# Patient Record
Sex: Male | Born: 1945 | Race: White | Hispanic: No | Smoking: Never smoker
Health system: Southern US, Community
[De-identification: ages and names within clinical notes are randomized; demographics above are authoritative.]

## PROBLEM LIST (undated history)

## (undated) DIAGNOSIS — M1990 Unspecified osteoarthritis, unspecified site: Secondary | ICD-10-CM

## (undated) DIAGNOSIS — Z973 Presence of spectacles and contact lenses: Secondary | ICD-10-CM

## (undated) DIAGNOSIS — C61 Malignant neoplasm of prostate: Secondary | ICD-10-CM

## (undated) DIAGNOSIS — K219 Gastro-esophageal reflux disease without esophagitis: Secondary | ICD-10-CM

## (undated) HISTORY — PX: TONSILLECTOMY: SUR1361

## (undated) HISTORY — PX: PROSTATE BIOPSY: SHX241

---

## 2001-10-10 ENCOUNTER — Ambulatory Visit (HOSPITAL_COMMUNITY): Admission: RE | Admit: 2001-10-10 | Discharge: 2001-10-10 | Payer: Self-pay | Admitting: Gastroenterology

## 2008-08-17 HISTORY — PX: TOTAL HIP ARTHROPLASTY: SHX124

## 2017-08-02 DIAGNOSIS — Z23 Encounter for immunization: Secondary | ICD-10-CM | POA: Diagnosis not present

## 2017-08-03 DIAGNOSIS — K921 Melena: Secondary | ICD-10-CM | POA: Diagnosis not present

## 2018-06-14 DIAGNOSIS — Z23 Encounter for immunization: Secondary | ICD-10-CM | POA: Diagnosis not present

## 2018-11-11 DIAGNOSIS — M25539 Pain in unspecified wrist: Secondary | ICD-10-CM | POA: Diagnosis not present

## 2019-05-26 DIAGNOSIS — Z23 Encounter for immunization: Secondary | ICD-10-CM | POA: Diagnosis not present

## 2019-08-18 HISTORY — PX: COLONOSCOPY: SHX174

## 2020-04-10 DIAGNOSIS — Z136 Encounter for screening for cardiovascular disorders: Secondary | ICD-10-CM | POA: Diagnosis not present

## 2020-04-10 DIAGNOSIS — R5383 Other fatigue: Secondary | ICD-10-CM | POA: Diagnosis not present

## 2020-04-10 DIAGNOSIS — Z20822 Contact with and (suspected) exposure to covid-19: Secondary | ICD-10-CM | POA: Diagnosis not present

## 2020-04-10 DIAGNOSIS — N5089 Other specified disorders of the male genital organs: Secondary | ICD-10-CM | POA: Diagnosis not present

## 2020-04-10 DIAGNOSIS — Z125 Encounter for screening for malignant neoplasm of prostate: Secondary | ICD-10-CM | POA: Diagnosis not present

## 2020-04-10 DIAGNOSIS — L821 Other seborrheic keratosis: Secondary | ICD-10-CM | POA: Diagnosis not present

## 2020-04-10 DIAGNOSIS — R972 Elevated prostate specific antigen [PSA]: Secondary | ICD-10-CM | POA: Diagnosis not present

## 2020-04-10 DIAGNOSIS — R202 Paresthesia of skin: Secondary | ICD-10-CM | POA: Diagnosis not present

## 2020-04-23 DIAGNOSIS — N529 Male erectile dysfunction, unspecified: Secondary | ICD-10-CM | POA: Diagnosis not present

## 2020-04-23 DIAGNOSIS — G5602 Carpal tunnel syndrome, left upper limb: Secondary | ICD-10-CM | POA: Diagnosis not present

## 2020-04-23 DIAGNOSIS — Z9189 Other specified personal risk factors, not elsewhere classified: Secondary | ICD-10-CM | POA: Diagnosis not present

## 2020-04-23 DIAGNOSIS — R972 Elevated prostate specific antigen [PSA]: Secondary | ICD-10-CM | POA: Diagnosis not present

## 2020-04-30 DIAGNOSIS — N503 Cyst of epididymis: Secondary | ICD-10-CM | POA: Diagnosis not present

## 2020-04-30 DIAGNOSIS — N5089 Other specified disorders of the male genital organs: Secondary | ICD-10-CM | POA: Diagnosis not present

## 2020-04-30 DIAGNOSIS — N433 Hydrocele, unspecified: Secondary | ICD-10-CM | POA: Diagnosis not present

## 2020-08-17 DIAGNOSIS — Z8616 Personal history of COVID-19: Secondary | ICD-10-CM

## 2020-08-17 HISTORY — DX: Personal history of COVID-19: Z86.16

## 2020-12-04 ENCOUNTER — Encounter: Payer: Self-pay | Admitting: Radiation Oncology

## 2020-12-04 NOTE — Progress Notes (Signed)
GU Location of Tumor / Histology: prostatic adenocarcinoma  If Prostate Cancer, Gleason Score is (3 + 4) and PSA is (4.6). Prostate volume: 21.7  Gerald Keith was initially consulted for further evaluation of an elevated psa in November 2021.    Biopsies of prostate (if applicable) revealed:   Past/Anticipated interventions by urology, if any: psa surveillance, prostate biopsy, referral to Dr. Tammi Klippel to discuss radiation options  Past/Anticipated interventions by medical oncology, if any: no  Weight changes, if any: denies  Bowel/Bladder complaints, if any: IPSS 3. SHIM 5. Denies dysuria or hematuria. Reports urinary leakage only if he is in a situation where he can't get to the bathroom. Denies any bowel complaints.   Nausea/Vomiting, if any: denies  Pain issues, if any:  denies  SAFETY ISSUES:  Prior radiation? denies  Pacemaker/ICD? Denies  Possible current pregnancy? no, male patient  Is the patient on methotrexate? denies  Current Complaints / other details:  75 year old male. Widowed. Resides in [place]. Accompanied by his sister today.

## 2020-12-06 ENCOUNTER — Ambulatory Visit
Admission: RE | Admit: 2020-12-06 | Discharge: 2020-12-06 | Disposition: A | Discharge status: Home / Self Care | Payer: Medicare Other | Source: Ambulatory Visit | Attending: Radiation Oncology | Admitting: Radiation Oncology

## 2020-12-06 ENCOUNTER — Other Ambulatory Visit: Payer: Self-pay

## 2020-12-06 ENCOUNTER — Encounter: Payer: Self-pay | Admitting: Radiation Oncology

## 2020-12-06 DIAGNOSIS — C61 Malignant neoplasm of prostate: Secondary | ICD-10-CM

## 2020-12-06 DIAGNOSIS — N529 Male erectile dysfunction, unspecified: Secondary | ICD-10-CM | POA: Insufficient documentation

## 2020-12-06 DIAGNOSIS — Z801 Family history of malignant neoplasm of trachea, bronchus and lung: Secondary | ICD-10-CM | POA: Insufficient documentation

## 2020-12-06 DIAGNOSIS — Z8052 Family history of malignant neoplasm of bladder: Secondary | ICD-10-CM | POA: Insufficient documentation

## 2020-12-06 DIAGNOSIS — Z803 Family history of malignant neoplasm of breast: Secondary | ICD-10-CM | POA: Insufficient documentation

## 2020-12-06 HISTORY — DX: Malignant neoplasm of prostate: C61

## 2020-12-06 NOTE — Progress Notes (Signed)
Radiation Oncology         (336) [phone] ________________________________  Initial outpatient Consultation  Name: Gerald Keith MRN: [id number]  Date: 12/06/2020  DOB: 22-Nov-1945  CC:Pcp, No  Franchot Gallo, MD   REFERRING PHYSICIAN: Franchot Gallo, MD  DIAGNOSIS: 75 y.o. gentleman with Stage T1c adenocarcinoma of the prostate with Gleason score of 3+4, and PSA of 4.6.    ICD-10-CM   1. Malignant neoplasm of prostate ([place])  C61     HISTORY OF PRESENT ILLNESS: Gerald Keith is a 75 y.o. male with a diagnosis of prostate cancer. He was noted to have an elevated PSA of 4.6 by his primary care physician, Dr. Lisbeth Ply.  Accordingly, he was referred for evaluation in urology by Dr. Diona Fanti on 07/01/20,  digital rectal examination was performed at that time revealing right lobe firmness but no discrete nodularity.  The patient proceeded to transrectal ultrasound with 12 biopsies of the prostate on 08/28/20.  The prostate volume measured 21.7 cc.  Out of 12 core biopsies, 5 were positive.  The maximum Gleason score was 3+4, and this was seen in the right apex and the left apex. Additionally, small foci of Gleason 3+3 were seen in the left mid lateral, the left apex lateral, and the right apex lateral.  The patient reviewed the biopsy results with his urologist and he has kindly been referred today for discussion of potential radiation treatment options.   PREVIOUS RADIATION THERAPY: No  PAST MEDICAL HISTORY:  Past Medical History:  Diagnosis Date  . Prostate cancer ([place])       PAST SURGICAL HISTORY: Past Surgical History:  Procedure Laterality Date  . PROSTATE BIOPSY      FAMILY HISTORY:  Family History  Problem Relation Age of Onset  . Lung cancer Father   . Breast cancer Sister   . Cancer Maternal Aunt   . Bladder Cancer Maternal Grandfather   . Breast cancer Cousin   . Cancer Maternal Aunt   . Prostate cancer Neg Hx   . Colon cancer Neg Hx   . Pancreatic cancer Neg Hx      SOCIAL HISTORY:  Social History   Socioeconomic History  . Marital status: Widowed    Spouse name: Not on file  . Number of children: 2  . Years of education: Not on file  . Highest education level: Not on file  Occupational History  . Not on file  Tobacco Use  . Smoking status: Never Smoker  . Smokeless tobacco: Never Used  Vaping Use  . Vaping Use: Never used  Substance and Sexual Activity  . Alcohol use: Not Currently  . Drug use: Never  . Sexual activity: Yes  Other Topics Concern  . Not on file  Social History Narrative   Children - 1 boy and 1 girl   Social Determinants of Radio broadcast assistant Strain: Not on file  Food Insecurity: Not on file  Transportation Needs: Not on file  Physical Activity: Not on file  Stress: Not on file  Social Connections: Not on file  Intimate Partner Violence: Not on file    ALLERGIES: Patient has no known allergies.  MEDICATIONS:  Current Outpatient Medications  Medication Sig Dispense Refill  . cholecalciferol (VITAMIN D3) 25 MCG (1000 UNIT) tablet Take 1,000 Units by mouth daily.    . Multiple Vitamin (MULTIVITAMIN) tablet Take 1 tablet by mouth daily.    Marland Kitchen QUERCETIN PO Take by mouth.    . Turmeric (CURCUMIN 95 PO) Take  by mouth.    . zinc sulfate (ZINC-220) 220 (50 Zn) MG capsule Take 220 mg by mouth daily.     No current facility-administered medications for this encounter.    REVIEW OF SYSTEMS:  On review of systems, the patient reports that he is doing well overall. He denies any chest pain, shortness of breath, cough, fevers, chills, night sweats, unintended weight changes. He denies any bowel disturbances, and denies abdominal pain, nausea or vomiting. He denies any new musculoskeletal or joint aches or pains. His IPSS was 3, indicating mild urinary symptoms. His SHIM was 5, indicating he has severe erectile dysfunction. A complete review of systems is obtained and is otherwise negative.   PHYSICAL EXAM:  Wt  Readings from Last 3 Encounters:  12/06/20 175 lb 6 oz (79.5 kg)   Temp Readings from Last 3 Encounters:  12/06/20 (!) 96.9 F (36.1 C) (Temporal)   BP Readings from Last 3 Encounters:  12/06/20 139/90   Pulse Readings from Last 3 Encounters:  12/06/20 81   Pain Assessment Pain Score: 0-No pain/10  In general this is a well appearing Caucasian male in no acute distress. He's alert and oriented x4 and appropriate throughout the examination. Cardiopulmonary assessment is negative for acute distress, and he exhibits normal effort.     KPS = 100  100 - Normal; no complaints; no evidence of disease. 90   - Able to carry on normal activity; minor signs or symptoms of disease. 80   - Normal activity with effort; some signs or symptoms of disease. 72   - Cares for self; unable to carry on normal activity or to do active work. 60   - Requires occasional assistance, but is able to care for most of his personal needs. 50   - Requires considerable assistance and frequent medical care. 31   - Disabled; requires special care and assistance. 39   - Severely disabled; hospital admission is indicated although death not imminent. 62   - Very sick; hospital admission necessary; active supportive treatment necessary. 10   - Moribund; fatal processes progressing rapidly. 0     - Dead  Karnofsky DA, Abelmann WH, Craver LS and Burchenal JH [phone]) The use of the nitrogen mustards in the palliative treatment of carcinoma: with particular reference to bronchogenic carcinoma Cancer 1 [phone]  LABORATORY DATA:  No results found for: WBC, HGB, HCT, MCV, PLT No results found for: NA, K, CL, CO2 No results found for: ALT, AST, GGT, ALKPHOS, BILITOT   RADIOGRAPHY: No results found.    IMPRESSION/PLAN: 1. 75 y.o. gentleman with Stage T1c adenocarcinoma of the prostate with Gleason Score of 3+4, and PSA of 4.6. We discussed the patient's workup and outlined the nature of prostate cancer in this setting. The  patient's T stage, Gleason's score, and PSA put him into the favorable intermediate risk group. Accordingly, he is eligible for a variety of potential treatment options including brachytherapy, 5.5 weeks of external radiation, or prostatectomy. We discussed the available radiation techniques, and focused on the details and logistics of delivery. We discussed and outlined the risks, benefits, short and long-term effects associated with radiotherapy and compared and contrasted these with prostatectomy. We discussed the role of SpaceOAR gel in reducing the rectal toxicity associated with radiotherapy. He appears to have a good understanding of his disease and our treatment recommendations which are of curative intent.  He and his sister were encouraged to ask questions that were answered to their stated satisfaction.  At  the conclusion of our conversation, the patient is interested in moving forward with brachytherapy and use of SpaceOAR gel to reduce rectal toxicity from radiotherapy.  We will share our discussion with Dr. Diona Fanti and move forward with scheduling his [facility] planning appointment in the near future.  The patient met briefly with Romie Jumper in our office who will be working closely with him to coordinate OR scheduling and pre and post procedure appointments.  We will contact the pharmaceutical rep to ensure that Ingram is available at the time of procedure.  We enjoyed meeting him today and look forward to continuing to participate in his care.  We spent 60 minutes total in this encounter.    Nicholos Johns, PA-C    Tyler Pita, MD  [facility] Direct Dial: [phone]  Fax: [phone] [email or web address]  Skype  LinkedIn   This document serves as a record of services personally performed by Tyler Pita, MD and Freeman Caldron, PA-C. It was created on their behalf by Wilburn Mylar, a trained medical scribe. The creation of this record is based  on the scribe's personal observations and the provider's statements to them. This document has been checked and approved by the attending provider.

## 2020-12-11 ENCOUNTER — Telehealth: Payer: Self-pay | Admitting: *Deleted

## 2020-12-11 NOTE — Telephone Encounter (Signed)
CALLED PATIENT TO UPDATE, SPOKE WITH PATIENT AND HE IS AWARE OF THESE PRE-SEED APPTS.

## 2020-12-19 ENCOUNTER — Telehealth: Payer: Self-pay | Admitting: *Deleted

## 2020-12-19 NOTE — Telephone Encounter (Signed)
Called patient to inform of pre-seed appts. and implant date, lvm for a return call

## 2021-01-29 ENCOUNTER — Telehealth: Payer: Self-pay | Admitting: *Deleted

## 2021-01-29 NOTE — Telephone Encounter (Signed)
RETURNED PATIENT'S SISTER'S PHONE CALL Alice Reichert), SPOKE WITH PATIENT'S SISTER

## 2021-01-31 ENCOUNTER — Other Ambulatory Visit: Payer: Self-pay | Admitting: Urology

## 2021-02-26 ENCOUNTER — Telehealth: Payer: Self-pay | Admitting: *Deleted

## 2021-02-26 NOTE — Progress Notes (Signed)
  Radiation Oncology         (336) [phone] ________________________________  Name: Kala Gassmann MRN: [id number]  Date: 02/27/2021  DOB: [birth year]  SIMULATION AND TREATMENT PLANNING NOTE PUBIC ARCH STUDY  CC:Pcp, No  Franchot Gallo, MD  DIAGNOSIS: 75 y.o. gentleman with Stage T1c adenocarcinoma of the prostate with Gleason score of 3+4, and PSA of 4.6.  Oncology History  Malignant neoplasm of prostate ([place])  08/28/2020 Cancer Staging   Staging form: Prostate, AJCC 8th Edition - Clinical stage from 08/28/2020: Stage IIB (cT1c, cN0, cM0, PSA: 4.6, Grade Group: 2) - Signed by Freeman Caldron, PA-C on 12/06/2020  Histopathologic type: Adenocarcinoma, NOS  Stage prefix: Initial diagnosis  Prostate specific antigen (PSA) range: Less than 10  Gleason primary pattern: 3  Gleason secondary pattern: 4  Gleason score: 7  Histologic grading system: 5 grade system  Number of biopsy cores examined: 12  Number of biopsy cores positive: 5  Location of positive needle core biopsies: Both sides    12/06/2020 Initial Diagnosis   Malignant neoplasm of prostate ([place])        ICD-10-CM   1. Malignant neoplasm of prostate ([place])  C61       COMPLEX SIMULATION:  The patient presented today for evaluation for possible prostate seed implant. He was brought to the radiation planning suite and placed supine on the CT couch. A 3-dimensional image study set was obtained in upload to the planning computer. There, on each axial slice, I contoured the prostate gland. Then, using three-dimensional radiation planning tools I reconstructed the prostate in view of the structures from the transperineal needle pathway to assess for possible pubic arch interference. In doing so, I did not appreciate any pubic arch interference. Also, the patient's prostate volume was estimated based on the drawn structure. The volume was 20 cc.  Given the pubic arch appearance and prostate volume, patient remains a good  candidate to proceed with prostate seed implant. Today, he freely provided informed written consent to proceed.    PLAN: The patient will undergo prostate seed implant.   ________________________________  [name]. Tammi Klippel, M.D.

## 2021-02-26 NOTE — Telephone Encounter (Signed)
Called patient to remind of pre-seed appts. for 02-27-21, spoke with patient and he is aware of these appts.

## 2021-02-27 ENCOUNTER — Encounter: Payer: Self-pay | Admitting: Urology

## 2021-02-27 ENCOUNTER — Other Ambulatory Visit: Payer: Self-pay

## 2021-02-27 ENCOUNTER — Encounter (HOSPITAL_COMMUNITY)
Admission: RE | Admit: 2021-02-27 | Discharge: 2021-02-27 | Disposition: A | Discharge status: Home / Self Care | Payer: Medicare Other | Source: Ambulatory Visit | Attending: Urology | Admitting: Urology

## 2021-02-27 ENCOUNTER — Ambulatory Visit (HOSPITAL_COMMUNITY)
Admission: RE | Admit: 2021-02-27 | Discharge: 2021-02-27 | Disposition: A | Discharge status: Home / Self Care | Payer: Medicare Other | Source: Ambulatory Visit | Attending: Urology | Admitting: Urology

## 2021-02-27 ENCOUNTER — Ambulatory Visit
Admission: RE | Admit: 2021-02-27 | Discharge: 2021-02-27 | Disposition: A | Discharge status: Home / Self Care | Payer: Medicare Other | Source: Ambulatory Visit | Attending: Radiation Oncology | Admitting: Radiation Oncology

## 2021-02-27 ENCOUNTER — Ambulatory Visit
Admission: RE | Admit: 2021-02-27 | Discharge: 2021-02-27 | Disposition: A | Discharge status: Home / Self Care | Payer: Medicare Other | Source: Ambulatory Visit | Attending: Urology | Admitting: Urology

## 2021-02-27 ENCOUNTER — Encounter (HOSPITAL_COMMUNITY): Admission: RE | Admit: 2021-02-27 | Payer: Medicare Other | Source: Ambulatory Visit

## 2021-02-27 DIAGNOSIS — C61 Malignant neoplasm of prostate: Secondary | ICD-10-CM | POA: Diagnosis not present

## 2021-02-27 DIAGNOSIS — Z01818 Encounter for other preprocedural examination: Secondary | ICD-10-CM | POA: Insufficient documentation

## 2021-02-27 NOTE — Progress Notes (Signed)
Patient in for pre seed appointment. Patient has pain in left hip is painful has had a right hip resurfacing. Pain is 2/10. Questions answered.

## 2021-03-25 ENCOUNTER — Encounter (HOSPITAL_BASED_OUTPATIENT_CLINIC_OR_DEPARTMENT_OTHER): Payer: Self-pay | Admitting: Urology

## 2021-03-25 ENCOUNTER — Other Ambulatory Visit: Payer: Self-pay

## 2021-03-25 NOTE — Progress Notes (Signed)
Spoke w/ via phone for pre-op interview--- Pt Lab needs dos----    no           Lab results------  pt getting lab done 03-27-2021 @ 1430 CBC, CMP, PT/ PTT COVID test -----patient states asymptomatic no test needed Arrive at ------- 0530 on 03-31-2021 NPO after MN NO Solid Food.  Clear liquids from MN until--- 0430 Med rec completed Medications to take morning of surgery ----- NONE Diabetic medication ----- n/a Patient instructed no nail polish to be worn day of surgery Patient instructed to bring photo id and insurance card day of surgery Patient aware to have Driver (ride ) / caregiver for 24 hours after surgery -- sister, Alice Reichert Patient Special Instructions ----- will do one fleet enema morning of surgery Pre-Op special Istructions ----- n/a Patient verbalized understanding of instructions that were given at this phone interview. Patient denies shortness of breath, chest pain, fever, cough at this phone interview.

## 2021-03-26 ENCOUNTER — Telehealth: Payer: Self-pay | Admitting: *Deleted

## 2021-03-26 NOTE — Telephone Encounter (Signed)
CALLED PATIENT TO REMIND OF LAB APPT. FOR 03-27-21, SPOKE WITH PATIENT AND HE IS AWARE OF THIS APPT.

## 2021-03-26 NOTE — Telephone Encounter (Signed)
XXXXX

## 2021-03-27 ENCOUNTER — Other Ambulatory Visit: Payer: Self-pay

## 2021-03-27 ENCOUNTER — Encounter (HOSPITAL_COMMUNITY)
Admission: RE | Admit: 2021-03-27 | Discharge: 2021-03-27 | Disposition: A | Discharge status: Home / Self Care | Payer: Medicare Other | Source: Ambulatory Visit | Attending: Urology | Admitting: Urology

## 2021-03-27 DIAGNOSIS — Z01812 Encounter for preprocedural laboratory examination: Secondary | ICD-10-CM | POA: Insufficient documentation

## 2021-03-27 LAB — COMPREHENSIVE METABOLIC PANEL
ALT: 25 U/L (ref 0–44)
AST: 29 U/L (ref 15–41)
Albumin: 4.2 g/dL (ref 3.5–5.0)
Alkaline Phosphatase: 53 U/L (ref 38–126)
Anion gap: 7 (ref 5–15)
BUN: 19 mg/dL (ref 8–23)
CO2: 24 mmol/L (ref 22–32)
Calcium: 9.4 mg/dL (ref 8.9–10.3)
Chloride: 109 mmol/L (ref 98–111)
Creatinine, Ser: 0.66 mg/dL (ref 0.61–1.24)
GFR, Estimated: >60 mL/min (ref >60)
Glucose, Bld: 92 mg/dL (ref 70–99)
Potassium: 4 mmol/L (ref 3.5–5.1)
Sodium: 140 mmol/L (ref 135–145)
Total Bilirubin: 0.7 mg/dL (ref 0.3–1.2)
Total Protein: 7.5 g/dL (ref 6.5–8.1)

## 2021-03-27 LAB — CBC
HCT: 44 % (ref 39.0–52.0)
Hemoglobin: 14.2 g/dL (ref 13.0–17.0)
MCH: 28.9 pg (ref 26.0–34.0)
MCHC: 32.3 g/dL (ref 30.0–36.0)
MCV: 89.6 fL (ref 80.0–100.0)
Platelets: 258 10*3/uL (ref 150–400)
RBC: 4.91 MIL/uL (ref 4.22–5.81)
RDW: 13.4 % (ref 11.5–15.5)
WBC: 7.2 10*3/uL (ref 4.0–10.5)
nRBC: 0 % (ref 0.0–0.2)

## 2021-03-27 LAB — PROTIME-INR
INR: 1.1 (ref 0.8–1.2)
Prothrombin Time: 13.8 seconds (ref 11.4–15.2)

## 2021-03-27 LAB — APTT: aPTT: 36 seconds (ref 24–36)

## 2021-03-28 ENCOUNTER — Telehealth: Payer: Self-pay | Admitting: *Deleted

## 2021-03-28 ENCOUNTER — Encounter (HOSPITAL_BASED_OUTPATIENT_CLINIC_OR_DEPARTMENT_OTHER): Payer: Self-pay | Admitting: Urology

## 2021-03-28 NOTE — Anesthesia Preprocedure Evaluation (Addendum)
Anesthesia Evaluation  Patient identified by MRN, date of birth, ID band Patient awake    Reviewed: Allergy & Precautions, NPO status , Patient's Chart, lab work & pertinent test results  Airway Mallampati: II  TM Distance: >3 FB Neck ROM: Full    Dental no notable dental hx.    Pulmonary  Covid 19 08/2020, mild Sx - resolved   Pulmonary exam normal breath sounds clear to auscultation       Cardiovascular negative cardio ROS Normal cardiovascular exam Rhythm:Regular Rate:Normal     Neuro/Psych negative neurological ROS  negative psych ROS   GI/Hepatic Neg liver ROS, GERD  Medicated and Controlled,  Endo/Other  negative endocrine ROS  Renal/GU negative Renal ROS   Prostate Ca    Musculoskeletal  (+) Arthritis , Osteoarthritis,    Abdominal   Peds  Hematology negative hematology ROS (+)   Anesthesia Other Findings   Reproductive/Obstetrics                            Anesthesia Physical Anesthesia Plan  ASA: 2  Anesthesia Plan: General   Post-op Pain Management:    Induction: Intravenous  PONV Risk Score and Plan: 4 or greater and Treatment may vary due to age or medical condition, Ondansetron and Dexamethasone  Airway Management Planned: LMA  Additional Equipment: None  Intra-op Plan:   Post-operative Plan: Extubation in OR  Informed Consent: I have reviewed the patients History and Physical, chart, labs and discussed the procedure including the risks, benefits and alternatives for the proposed anesthesia with the patient or authorized representative who has indicated his/her understanding and acceptance.     Dental advisory given  Plan Discussed with: CRNA and Anesthesiologist  Anesthesia Plan Comments:        Anesthesia Quick Evaluation

## 2021-03-28 NOTE — Telephone Encounter (Signed)
CALLED PATIENT TO REMIND OF PROCEDURE FOR 03-31-21, LVM FOR A RETURN CALL

## 2021-03-30 NOTE — H&P (Signed)
H&P  Chief Complaint: Prostate cancer  History of Present Illness: 75 yo male with GG 2 PCa presents for I 125 brachytherapy and Space OAR placement.  Past Medical History:  Diagnosis Date   Arthritis    GERD (gastroesophageal reflux disease)    History of 2019 novel coronavirus disease (COVID-19) 08/2020   per pt mild symptoms that resolved   Prostate cancer [facility])    urologist-- dr Diona Fanti---  dx 01/ 2022, Gleason 3+4   Wears glasses     Past Surgical History:  Procedure Laterality Date   COLONOSCOPY  2021   last one   PROSTATE BIOPSY     TONSILLECTOMY     age 39   TOTAL HIP ARTHROPLASTY Right 2010    Home Medications:  Allergies as of 03/30/2021   No Known Allergies      Medication List      Notice   Cannot display discharge medications because the patient has not yet been admitted.     Allergies: No Known Allergies  Family History  Problem Relation Age of Onset   Lung cancer Father    Breast cancer Sister    Cancer Maternal Aunt    Bladder Cancer Maternal Grandfather    Breast cancer Cousin    Cancer Maternal Aunt    Prostate cancer Neg Hx    Colon cancer Neg Hx    Pancreatic cancer Neg Hx     Social History:  reports that he has never smoked. He has never used smokeless tobacco. He reports current alcohol use. He reports that he does not use drugs.  ROS: A complete review of systems was performed.  All systems are negative except for pertinent findings as noted.  Physical Exam:  Vital signs in last 24 hours: Ht '5\' 7"'$  (1.702 m)   Wt 79.4 kg   BMI 27.41 kg/m  Constitutional:  Alert and oriented, No acute distress Cardiovascular: Regular rate  Respiratory: Normal respiratory effort GI: Abdomen is soft, nontender, nondistended, no abdominal masses. No CVAT.  Genitourinary: Normal male phallus, testes are descended bilaterally and non-tender and without masses, scrotum is normal in appearance without lesions or masses, perineum is normal on  inspection. Lymphatic: No lymphadenopathy Neurologic: Grossly intact, no focal deficits Psychiatric: Normal mood and affect  Laboratory Data:  No results for input(s): WBC, HGB, HCT, PLT in the last 72 hours.  No results for input(s): NA, K, CL, GLUCOSE, BUN, CALCIUM, CREATININE in the last 72 hours.  Invalid input(s): CO3   No results found for this or any previous visit (from the past 24 hour(s)). No results found for this or any previous visit (from the past 240 hour(s)).  Renal Function: Recent Labs    03/27/21 1449  CREATININE 0.66   Estimated Creatinine Clearance: 80.6 mL/min (by C-G formula based on SCr of 0.66 mg/dL).  Radiologic Imaging: No results found.  Impression/Assessment:  PCa  Plan:  I 125 brachytherapy, SpaceOAr

## 2021-03-31 ENCOUNTER — Encounter (HOSPITAL_BASED_OUTPATIENT_CLINIC_OR_DEPARTMENT_OTHER): Payer: Self-pay | Admitting: Urology

## 2021-03-31 ENCOUNTER — Ambulatory Visit (HOSPITAL_BASED_OUTPATIENT_CLINIC_OR_DEPARTMENT_OTHER): Payer: Medicare Other | Admitting: Anesthesiology

## 2021-03-31 ENCOUNTER — Other Ambulatory Visit: Payer: Self-pay

## 2021-03-31 ENCOUNTER — Ambulatory Visit (HOSPITAL_BASED_OUTPATIENT_CLINIC_OR_DEPARTMENT_OTHER)
Admission: RE | Admit: 2021-03-31 | Discharge: 2021-03-31 | Disposition: A | Discharge status: Home / Self Care | Payer: Medicare Other | Attending: Urology | Admitting: Urology

## 2021-03-31 ENCOUNTER — Ambulatory Visit (HOSPITAL_COMMUNITY): Payer: Medicare Other

## 2021-03-31 ENCOUNTER — Encounter (HOSPITAL_BASED_OUTPATIENT_CLINIC_OR_DEPARTMENT_OTHER)
Admission: RE | Disposition: A | Discharge status: Home / Self Care | Payer: Self-pay | Source: Home / Self Care | Attending: Urology

## 2021-03-31 DIAGNOSIS — Z8616 Personal history of COVID-19: Secondary | ICD-10-CM | POA: Insufficient documentation

## 2021-03-31 DIAGNOSIS — Z96641 Presence of right artificial hip joint: Secondary | ICD-10-CM | POA: Diagnosis not present

## 2021-03-31 DIAGNOSIS — C61 Malignant neoplasm of prostate: Secondary | ICD-10-CM | POA: Diagnosis present

## 2021-03-31 HISTORY — DX: Gastro-esophageal reflux disease without esophagitis: K21.9

## 2021-03-31 HISTORY — PX: SPACE OAR INSTILLATION: SHX6769

## 2021-03-31 HISTORY — PX: RADIOACTIVE SEED IMPLANT: SHX5150

## 2021-03-31 HISTORY — DX: Unspecified osteoarthritis, unspecified site: M19.90

## 2021-03-31 HISTORY — DX: Presence of spectacles and contact lenses: Z97.3

## 2021-03-31 SURGERY — INSERTION, RADIATION SOURCE, PROSTATE
Anesthesia: General

## 2021-03-31 MED ORDER — FENTANYL CITRATE (PF) 100 MCG/2ML IJ SOLN
INTRAMUSCULAR | Status: DC | PRN
  Administered 2021-03-31: 25 ug via INTRAVENOUS
  Administered 2021-03-31: 75 ug via INTRAVENOUS

## 2021-03-31 MED ORDER — PROPOFOL 10 MG/ML IV BOLUS
INTRAVENOUS | Status: DC | PRN
  Administered 2021-03-31: 200 mg via INTRAVENOUS

## 2021-03-31 MED ORDER — CEFAZOLIN SODIUM-DEXTROSE 2-4 GM/100ML-% IV SOLN
2.0000 g | Freq: Once | INTRAVENOUS | Status: AC
  Administered 2021-03-31: 2 g via INTRAVENOUS

## 2021-03-31 MED ORDER — PROPOFOL 10 MG/ML IV BOLUS
INTRAVENOUS | Status: AC
  Filled 2021-03-31: qty 20

## 2021-03-31 MED ORDER — SODIUM CHLORIDE (PF) 0.9 % IJ SOLN
INTRAMUSCULAR | Status: DC | PRN
  Administered 2021-03-31: 10 mL

## 2021-03-31 MED ORDER — ONDANSETRON HCL 4 MG/2ML IJ SOLN
INTRAMUSCULAR | Status: AC
  Filled 2021-03-31: qty 2

## 2021-03-31 MED ORDER — FLEET ENEMA 7-19 GM/118ML RE ENEM: 1.0000 | ENEMA | Freq: Once | RECTAL | Status: DC

## 2021-03-31 MED ORDER — CEFAZOLIN SODIUM-DEXTROSE 2-4 GM/100ML-% IV SOLN
INTRAVENOUS | Status: AC
  Filled 2021-03-31: qty 100

## 2021-03-31 MED ORDER — ONDANSETRON HCL 4 MG/2ML IJ SOLN
INTRAMUSCULAR | Status: DC | PRN
  Administered 2021-03-31: 4 mg via INTRAVENOUS

## 2021-03-31 MED ORDER — DEXAMETHASONE SODIUM PHOSPHATE 10 MG/ML IJ SOLN
INTRAMUSCULAR | Status: AC
  Filled 2021-03-31: qty 1

## 2021-03-31 MED ORDER — FENTANYL CITRATE (PF) 100 MCG/2ML IJ SOLN
INTRAMUSCULAR | Status: AC
  Filled 2021-03-31: qty 2

## 2021-03-31 MED ORDER — LIDOCAINE 2% (20 MG/ML) 5 ML SYRINGE
INTRAMUSCULAR | Status: DC | PRN
  Administered 2021-03-31: 80 mg via INTRAVENOUS

## 2021-03-31 MED ORDER — SODIUM CHLORIDE 0.9 % IR SOLN
Status: DC | PRN
  Administered 2021-03-31: 1000 mL via INTRAVESICAL

## 2021-03-31 MED ORDER — IOHEXOL 300 MG/ML  SOLN
INTRAMUSCULAR | Status: DC | PRN
  Administered 2021-03-31: 7 mL

## 2021-03-31 MED ORDER — EPHEDRINE SULFATE-NACL 50-0.9 MG/10ML-% IV SOSY
PREFILLED_SYRINGE | INTRAVENOUS | Status: DC | PRN
  Administered 2021-03-31: 5 mg via INTRAVENOUS
  Administered 2021-03-31: 10 mg via INTRAVENOUS
  Administered 2021-03-31: 5 mg via INTRAVENOUS

## 2021-03-31 MED ORDER — DEXAMETHASONE SODIUM PHOSPHATE 10 MG/ML IJ SOLN
INTRAMUSCULAR | Status: DC | PRN
  Administered 2021-03-31: 10 mg via INTRAVENOUS

## 2021-03-31 MED ORDER — LACTATED RINGERS IV SOLN: INTRAVENOUS | Status: DC

## 2021-03-31 SURGICAL SUPPLY — 36 items
BAG DRN RND TRDRP ANRFLXCHMBR (UROLOGICAL SUPPLIES) ×1
BAG URINE DRAIN 2000ML AR STRL (UROLOGICAL SUPPLIES) ×2 IMPLANT
BLADE CLIPPER SENSICLIP SURGIC (BLADE) ×2 IMPLANT
CATH FOLEY 2WAY SLVR  5CC 16FR (CATHETERS) ×2
CATH FOLEY 2WAY SLVR 5CC 16FR (CATHETERS) ×1 IMPLANT
CATH ROBINSON RED A/P 16FR (CATHETERS) IMPLANT
CATH ROBINSON RED A/P 20FR (CATHETERS) ×2 IMPLANT
CLOTH BEACON ORANGE TIMEOUT ST (SAFETY) ×2 IMPLANT
CNTNR URN SCR LID CUP LEK RST (MISCELLANEOUS) ×2 IMPLANT
CONT SPEC 4OZ STRL OR WHT (MISCELLANEOUS) ×4
COVER BACK TABLE 60X90IN (DRAPES) ×2 IMPLANT
COVER MAYO STAND STRL (DRAPES) ×2 IMPLANT
DRAPE C-ARM 35X43 STRL (DRAPES) ×2 IMPLANT
DRSG TEGADERM 4X4.75 (GAUZE/BANDAGES/DRESSINGS) ×3 IMPLANT
DRSG TEGADERM 8X12 (GAUZE/BANDAGES/DRESSINGS) ×4 IMPLANT
GAUZE SPONGE 4X4 12PLY STRL LF (GAUZE/BANDAGES/DRESSINGS) ×1 IMPLANT
GLOVE SURG ENC MOIS LTX SZ6.5 (GLOVE) ×2 IMPLANT
GLOVE SURG ENC MOIS LTX SZ7.5 (GLOVE) ×1 IMPLANT
GLOVE SURG ENC MOIS LTX SZ8 (GLOVE) ×4 IMPLANT
GLOVE SURG ORTHO LTX SZ8.5 (GLOVE) ×2 IMPLANT
GLOVE SURG UNDER POLY LF SZ6.5 (GLOVE) IMPLANT
GOWN STRL REUS W/TWL XL LVL3 (GOWN DISPOSABLE) ×2 IMPLANT
HOLDER FOLEY CATH W/STRAP (MISCELLANEOUS) ×2 IMPLANT
IMPL SPACEOAR VUE SYSTEM (Spacer) ×1 IMPLANT
IMPLANT SPACEOAR VUE SYSTEM (Spacer) ×2 IMPLANT
ISEED AGX100 ×64 IMPLANT
IV NS 1000ML (IV SOLUTION) ×2
IV NS 1000ML BAXH (IV SOLUTION) ×1 IMPLANT
KIT TURNOVER CYSTO (KITS) ×2 IMPLANT
MARKER SKIN DUAL TIP RULER LAB (MISCELLANEOUS) ×2 IMPLANT
PACK CYSTO (CUSTOM PROCEDURE TRAY) ×2 IMPLANT
SUT BONE WAX W31G (SUTURE) IMPLANT
SYR 10ML LL (SYRINGE) ×2 IMPLANT
TOWEL OR 17X26 10 PK STRL BLUE (TOWEL DISPOSABLE) ×2 IMPLANT
UNDERPAD 30X36 HEAVY ABSORB (UNDERPADS AND DIAPERS) ×4 IMPLANT
WATER STERILE IRR 500ML POUR (IV SOLUTION) ×2 IMPLANT

## 2021-03-31 NOTE — Progress Notes (Signed)
  Radiation Oncology         [phone]) [phone] ________________________________  Name: [name] MRN: [id number]  Date: 03/31/2021  DOB: 23-Mar-1946       Prostate Seed Implant  [id number], Lorin Mercy, MD  No ref. provider found  DIAGNOSIS: 75 y.o. gentleman with Stage T1c adenocarcinoma of the prostate with Gleason score of 3+4, and PSA of 4.6.  Oncology History  Malignant neoplasm of prostate ([place])  08/28/2020 Cancer Staging   Staging form: Prostate, AJCC 8th Edition - Clinical stage from 08/28/2020: Stage IIB (cT1c, cN0, cM0, PSA: 4.6, Grade Group: 2) - Signed by Freeman Caldron, PA-C on 12/06/2020 Histopathologic type: Adenocarcinoma, NOS Stage prefix: Initial diagnosis Prostate specific antigen (PSA) range: Less than 10 Gleason primary pattern: 3 Gleason secondary pattern: 4 Gleason score: 7 Histologic grading system: 5 grade system Number of biopsy cores examined: 12 Number of biopsy cores positive: 5 Location of positive needle core biopsies: Both sides   12/06/2020 Initial Diagnosis   Malignant neoplasm of prostate ([place])     No diagnosis found.  PROCEDURE: Insertion of radioactive I-125 seeds into the prostate gland.  RADIATION DOSE: 145 Gy, definitive therapy.  TECHNIQUE: Robin Babinec. was brought to the operating room with the urologist. He was placed in the dorsolithotomy position. He was catheterized and a rectal tube was inserted. The perineum was shaved, prepped and draped. The ultrasound probe was then introduced into the rectum to see the prostate gland.  TREATMENT DEVICE: A needle grid was attached to the ultrasound probe stand and anchor needles were placed.  3D PLANNING: The prostate was imaged in 3D using a sagittal sweep of the prostate probe. These images were transferred to the planning computer. There, the prostate, urethra and rectum were defined on each axial reconstructed image. Then, the software created an optimized 3D plan and a few seed  positions were adjusted. The quality of the plan was reviewed using [facility] information for the target and the following two organs at risk:  Urethra and Rectum.  Then the accepted plan was printed and handed off to the radiation therapist.  Under my supervision, the custom loading of the seeds and spacers was carried out and loaded into sealed vicryl sleeves.  These pre-loaded needles were then placed into the needle holder.Marland Kitchen  PROSTATE VOLUME STUDY:  Using transrectal ultrasound the volume of the prostate was verified to be 22.9 cc.  SPECIAL TREATMENT PROCEDURE/SUPERVISION AND HANDLING: The pre-loaded needles were then delivered under sagittal guidance. A total of 18 needles were used to deposit 64 seeds in the prostate gland. The individual seed activity was 0.311 mCi.  SpaceOAR:  Yes  COMPLEX SIMULATION: At the end of the procedure, an anterior radiograph of the pelvis was obtained to document seed positioning and count. Cystoscopy was performed to check the urethra and bladder.  MICRODOSIMETRY: At the end of the procedure, the patient was emitting 0.085 mR/hr at 1 meter. Accordingly, he was considered safe for hospital discharge.  PLAN: The patient will return to the radiation oncology clinic for post implant CT dosimetry in three weeks.   ________________________________  [name] Tammi Klippel, M.D.

## 2021-03-31 NOTE — Op Note (Signed)
Preoperative diagnosis: Clinical stage TI C adenocarcinoma the prostate   Postoperative diagnosis: Same   Procedure: I-125 prostate seed implantation, SpaceOAR placement, flexible cystoscopy  Surgeon: Bastian Andreoli M. Gurney Balthazor M.D.  Radiation Oncologist: Matthew Manning, M.D.  Anesthesia: Gen.   Indications: Patient  was diagnosed with clinical stage TI C prostate cancer. We had extensive discussion with him about treatment options versus. He elected to proceed with seed implantation. He underwent consultation my office as well as with Dr. Manning. He appeared to understand the advantages disadvantages potential risks of this treatment option. Full informed consent has been obtained.   Technique and findings: Patient was brought the operating room where he had successful induction of general anesthesia. He was placed in dorso-lithotomy position and prepped and draped in usual manner. Appropriate surgical timeout was performed. Radiation oncology department placed a transrectal ultrasound probe anchoring stand. Foley catheter with contrast in the balloon was inserted without difficulty. Anchoring needles were placed within the prostate. Rectal tube was placed. Real-time contouring of the urethra prostate and rectum were performed and the dosing parameters were established. Targeted dose was 145 gray.  I was then called  to the operating suite suite for placement of the needles. A second timeout was performed. All needle passage was done with real-time transrectal ultrasound guidance with the sagittal plane. A total of 18 needles were placed.  64 active seeds were implanted.  I then proceeded with placement of SpaceOAR by introducing a needle with the bevel angled inferiorly approximately 2 cm superior to the anus. This was angled downward and under direct ultrasound was placed within the space between the prostatic capsule and rectum. This was confirmed with a small amount of sterile saline injected and  this was performed under direct ultrasound. I then attached the SpaceOAR to the needle and injected this in the space between the prostate and rectum with good placement noted. The Foley catheter was removed and flexible cystoscopy failed to show any seeds outside the prostate.  The patient was brought to recovery room in stable condition, having tolerated the procedure well..     

## 2021-03-31 NOTE — Anesthesia Postprocedure Evaluation (Signed)
Anesthesia Post Note  Patient: Gerald Keith.  Procedure(s) Performed: RADIOACTIVE SEED IMPLANT/BRACHYTHERAPY IMPLANT SPACE OAR INSTILLATION     Patient location during evaluation: PACU Anesthesia Type: General Level of consciousness: awake and alert Pain management: pain level controlled Vital Signs Assessment: post-procedure vital signs reviewed and stable Respiratory status: spontaneous breathing, nonlabored ventilation and respiratory function stable Cardiovascular status: blood pressure returned to baseline and stable Postop Assessment: no apparent nausea or vomiting Anesthetic complications: no   No notable events documented.  Last Vitals:  Vitals:   03/31/21 0900 03/31/21 0930  BP: 124/86 129/81  Pulse: 87 76  Resp: 12 13  Temp: 36.4 C   SpO2: 96% 99%    Last Pain:  Vitals:   03/31/21 0900  TempSrc: Oral  PainSc:                  Taylorann Tkach A.

## 2021-03-31 NOTE — Anesthesia Procedure Notes (Signed)
Procedure Name: LMA Insertion Date/Time: 03/31/2021 7:44 AM Performed by: Odis Wickey D, CRNA Pre-anesthesia Checklist: Patient identified, Emergency Drugs available, Suction available and Patient being monitored Patient Re-evaluated:Patient Re-evaluated prior to induction Oxygen Delivery Method: Circle system utilized Preoxygenation: Pre-oxygenation with 100% oxygen Induction Type: IV induction Ventilation: Mask ventilation without difficulty LMA: LMA inserted LMA Size: 4.0 Tube type: Oral Number of attempts: 1 Placement Confirmation: positive ETCO2 and breath sounds checked- equal and bilateral Tube secured with: Tape Dental Injury: Teeth and Oropharynx as per pre-operative assessment

## 2021-03-31 NOTE — Interval H&P Note (Signed)
History and Physical Interval Note:  03/31/2021 7:24 AM  [name]  has presented today for surgery, with the diagnosis of PROSTATE CANCER.  The various methods of treatment have been discussed with the patient and family. After consideration of risks, benefits and other options for treatment, the patient has consented to  Procedure(s): RADIOACTIVE SEED IMPLANT/BRACHYTHERAPY IMPLANT (N/A) SPACE OAR INSTILLATION (N/A) as a surgical intervention.  The patient's history has been reviewed, patient examined, no change in status, stable for surgery.  I have reviewed the patient's chart and labs.  Questions were answered to the patient's satisfaction.     Gerald Keith

## 2021-03-31 NOTE — Transfer of Care (Signed)
Immediate Anesthesia Transfer of Care Note  Patient: Gerald Keith.  Procedure(s) Performed: RADIOACTIVE SEED IMPLANT/BRACHYTHERAPY IMPLANT SPACE OAR INSTILLATION  Patient Location: PACU  Anesthesia Type:General  Level of Consciousness: awake, alert  and oriented  Airway & Oxygen Therapy: Patient Spontanous Breathing and Patient connected to nasal cannula oxygen  Post-op Assessment: Report given to RN and Post -op Vital signs reviewed and stable  Post vital signs: Reviewed and stable  Last Vitals:  Vitals Value Taken Time  BP 124/86 03/31/21 0858  Temp 36.4 C 03/31/21 0900  Pulse 85 03/31/21 0900  Resp 17 03/31/21 0900  SpO2 93 % 03/31/21 0900  Vitals shown include unvalidated device data.  Last Pain:  Vitals:   03/31/21 0900  TempSrc: Oral  PainSc:          Complications: No notable events documented.

## 2021-03-31 NOTE — Discharge Instructions (Addendum)
Radioactive Seed Implant Home Care Instructions   Activity:    Rest for the remainder of the day.  Do not drive or operate equipment today.  You may resume normal  activities in a few days as instructed by your physician, without risk of harmful radiation exposure to those around you, provided you follow the time and distance precautions on the Radiation Oncology Instruction Sheet.   Meals: Drink plenty of lipuids and eat light foods, such as gelatin or soup this evening .  You may return to normal meal plan tomorrow.  Return To Work: You may return to work as instructed by your physician.  Special Instruction:   If any seeds are found, use tweezers to pick up seeds and place in a glass container of any kind and bring to your physician's office.  Call your physician if any of these symptoms occur:  Persistent or heavy bleeding Urine stream diminishes or stops completely after catheter is removed Fever equal to or greater than 101 degrees F Cloudy urine with a strong foul odor Severe pain  You may feel some burning pain and/or hesitancy when you urinate after the catheter is removed.  These symptoms may increase over the next few weeks, but should diminish within forur to six weeks.  Applying moist heat to the lower abdomen or a hot tub bath may help relieve the pain.  If the discomfort becomes severe, please call your physician for additional medications.   Post Anesthesia Home Care Instructions  Activity: Get plenty of rest for the remainder of the day. A responsible adult should stay with you for 24 hours following the procedure.  For the next 24 hours, DO NOT: -Drive a car -Operate machinery -Drink alcoholic beverages -Take any medication unless instructed by your physician -Make any legal decisions or sign important papers.  Meals: Start with liquid foods such as gelatin or soup. Progress to regular foods as tolerated. Avoid greasy, spicy, heavy foods. If nausea and/or  vomiting occur, drink only clear liquids until the nausea and/or vomiting subsides. Call your physician if vomiting continues.  Special Instructions/Symptoms: Your throat may feel dry or sore from the anesthesia or the breathing tube placed in your throat during surgery. If this causes discomfort, gargle with warm salt water. The discomfort should disappear within 24 hours.  

## 2021-04-01 ENCOUNTER — Encounter (HOSPITAL_BASED_OUTPATIENT_CLINIC_OR_DEPARTMENT_OTHER): Payer: Self-pay | Admitting: Urology

## 2021-04-14 ENCOUNTER — Telehealth: Payer: Self-pay | Admitting: *Deleted

## 2021-04-14 NOTE — Telephone Encounter (Signed)
RETURNED PATIENT'S PHONE CALL, SPOKE WITH PATIENT. ?

## 2021-04-22 ENCOUNTER — Telehealth: Payer: Self-pay | Admitting: *Deleted

## 2021-04-22 NOTE — Telephone Encounter (Signed)
CALLED PATIENT TO REMIND OF POST SEED APPTS. FOR 04-23-21, SPOKE WITH PATIENT AND HE IS AWARE OF THESE APPTS.

## 2021-04-23 ENCOUNTER — Encounter: Payer: Self-pay | Admitting: Urology

## 2021-04-23 ENCOUNTER — Ambulatory Visit
Admission: RE | Admit: 2021-04-23 | Discharge: 2021-04-23 | Disposition: A | Discharge status: Home / Self Care | Payer: Medicare Other | Source: Ambulatory Visit | Attending: Radiation Oncology | Admitting: Radiation Oncology

## 2021-04-23 ENCOUNTER — Other Ambulatory Visit: Payer: Self-pay

## 2021-04-23 ENCOUNTER — Ambulatory Visit
Admission: RE | Admit: 2021-04-23 | Discharge: 2021-04-23 | Disposition: A | Discharge status: Home / Self Care | Payer: Medicare Other | Source: Ambulatory Visit | Attending: Urology | Admitting: Urology

## 2021-04-23 VITALS — BP 131/75 | HR 63 | Temp 97.4°F | Resp 18 | Ht 67.0 in | Wt 175.0 lb

## 2021-04-23 DIAGNOSIS — R350 Frequency of micturition: Secondary | ICD-10-CM | POA: Diagnosis not present

## 2021-04-23 DIAGNOSIS — R3911 Hesitancy of micturition: Secondary | ICD-10-CM | POA: Insufficient documentation

## 2021-04-23 DIAGNOSIS — C61 Malignant neoplasm of prostate: Secondary | ICD-10-CM | POA: Diagnosis present

## 2021-04-23 DIAGNOSIS — Z79899 Other long term (current) drug therapy: Secondary | ICD-10-CM | POA: Diagnosis not present

## 2021-04-23 NOTE — Progress Notes (Addendum)
Patient reports moderate fatigue, and nocturia x1 w/ frequency/ urgency. No other symptoms reported at this time. Patient on no voiding medications at this time.   Urology follow up November 18th @ 3:15pm.  I-PSS score of 12(moderate). Meaningful use complete.   BP 131/75 (Patient Position: Sitting)   Pulse 63   Temp (!) 97.4 F (36.3 C) (Temporal)   Resp 18   Ht '5\' 7"'$  (1.702 m)   Wt 175 lb (79.4 kg)   SpO2 100%   BMI 27.41 kg/m

## 2021-04-23 NOTE — Progress Notes (Signed)
Radiation Oncology         [phone]) [phone] ________________________________  Name: Gerald Keith. MRN: [id number]  Date: 04/23/2021  DOB: 1945/12/21  Post-Seed Follow-Up Visit Note  CC: Hamrick, Lorin Mercy, MD  Franchot Gallo, MD  Diagnosis:   75 y.o. gentleman with Stage T1c adenocarcinoma of the prostate with Gleason score of 3+4, and PSA of 4.6.    ICD-10-CM   1. Malignant neoplasm of prostate (HCC)  C61       Interval Since Last Radiation:  3 weeks 03/31/21:  Insertion of radioactive I-125 seeds into the prostate gland; 145 Gy, definitive therapy with placement of SpaceOAR gel.  Narrative:  The patient returns today for routine follow-up.  He is complaining of increased urinary frequency and urinary hesitation symptoms. He filled out a questionnaire regarding urinary function today providing and overall IPSS score of 12 characterizing his symptoms as moderate with nocturia x1, intermittency, frequency and urgency.  He specifically denies dysuria, gross hematuria, straining to void, incomplete bladder emptying or incontinence.  His pre-implant score was 3. He denies any abdominal pain or bowel symptoms.  Overall, he is quite pleased with his progress to date.  ALLERGIES:  has No Known Allergies.  Meds: Current Outpatient Medications  Medication Sig Dispense Refill   acetaminophen (TYLENOL) 500 MG tablet Take 500 mg by mouth every 6 (six) hours as needed.     cholecalciferol (VITAMIN D3) 25 MCG (1000 UNIT) tablet Take 1,000 Units by mouth daily.     Multiple Vitamin (MULTIVITAMIN) tablet Take 1 tablet by mouth daily.     Omega-3 Fatty Acids (FISH OIL PO) Take by mouth daily.     QUERCETIN PO Take by mouth daily.     Turmeric (CURCUMIN 95 PO) Take 1 capsule by mouth daily.     zinc sulfate 220 (50 Zn) MG capsule Take 220 mg by mouth daily.     No current facility-administered medications for this encounter.    Physical Findings: In general this is a well appearing Caucasian  male in no acute distress. He's alert and oriented x4 and appropriate throughout the examination. Cardiopulmonary assessment is negative for acute distress and he exhibits normal effort.   Lab Findings: Lab Results  Component Value Date   WBC 7.2 03/27/2021   HGB 14.2 03/27/2021   HCT 44.0 03/27/2021   MCV 89.6 03/27/2021   PLT 258 03/27/2021    Radiographic Findings:  Patient underwent CT imaging in our clinic for post implant dosimetry. The CT will be reviewed by Dr. Tammi Klippel to confirm there is an adequate distribution of radioactive seeds throughout the prostate gland and ensure that there are no seeds in or near the rectum.  We suspect the final radiation plan and dosimetry will show appropriate coverage of the prostate gland. He understands that we will call and inform him of any unexpected findings on further review of his imaging and dosimetry.  Impression/Plan: 75 y.o. gentleman with Stage T1c adenocarcinoma of the prostate with Gleason score of 3+4, and PSA of 4.6. The patient is recovering from the effects of radiation. His urinary symptoms should gradually improve over the next 4-6 months. We talked about this today. He is encouraged by his improvement already and is otherwise pleased with his outcome. We also talked about long-term follow-up for prostate cancer following seed implant. He understands that ongoing PSA determinations and digital rectal exams will help perform surveillance to rule out disease recurrence. He has a follow up appointment scheduled with Dr.  Dahlstedt on 07/04/21. He understands what to expect with his PSA measures. Patient was also educated today about some of the long-term effects from radiation including a small risk for rectal bleeding and possibly erectile dysfunction. We talked about some of the general management approaches to these potential complications. However, I did encourage the patient to contact our office or return at any point if he has questions  or concerns related to his previous radiation and prostate cancer.    Nicholos Johns, PA-C

## 2021-04-23 NOTE — Progress Notes (Signed)
  Radiation Oncology         [phone]) [phone] ________________________________  Name: Gerald Keith. MRN: [id number]  Date: 04/23/2021  DOB: 11-12-1945  COMPLEX SIMULATION NOTE  NARRATIVE:  The patient was brought to the [facility] today following prostate seed implantation approximately one month ago.  Identity was confirmed.  All relevant records and images related to the planned course of therapy were reviewed.  Then, the patient was set-up supine.  CT images were obtained.  The CT images were loaded into the planning software.  Then the prostate and rectum were contoured.  Treatment planning then occurred.  The implanted iodine 125 seeds were identified by the physics staff for projection of radiation distribution  I have requested : 3D Simulation  I have requested a DVH of the following structures: Prostate and rectum.    ________________________________  [name] Tammi Klippel, M.D.

## 2021-04-25 ENCOUNTER — Encounter: Payer: Self-pay | Admitting: Radiation Oncology

## 2021-04-25 DIAGNOSIS — C61 Malignant neoplasm of prostate: Secondary | ICD-10-CM | POA: Diagnosis not present

## 2021-05-01 ENCOUNTER — Other Ambulatory Visit: Payer: Self-pay | Admitting: Urology

## 2021-05-01 DIAGNOSIS — C61 Malignant neoplasm of prostate: Secondary | ICD-10-CM

## 2021-05-20 NOTE — Progress Notes (Signed)
  Radiation Oncology         [phone]) [phone] ________________________________  Name: Gerald Keith. MRN: [id number]  Date: 04/25/2021  DOB: November 22, 1945  3D Planning Note   Prostate Brachytherapy Post-Implant Dosimetry  Diagnosis: 75 y.o. gentleman with Stage T1c adenocarcinoma of the prostate with Gleason score of 3+4, and PSA of 4.6.  Narrative: On a previous date, Gerald Keith. returned following prostate seed implantation for post implant planning. He underwent CT scan complex simulation to delineate the three-dimensional structures of the pelvis and demonstrate the radiation distribution.  Since that time, the seed localization, and complex isodose planning with dose volume histograms have now been completed.  Results:   Prostate Coverage - The dose of radiation delivered to the 90% or more of the prostate gland (D90) was 99.41% of the prescription dose. This exceeds our goal of greater than 90%. Rectal Sparing - The volume of rectal tissue receiving the prescription dose or higher was 0.0 cc. This falls under our thresholds tolerance of 1.0 cc.  Impression: The prostate seed implant appears to show adequate target coverage and appropriate rectal sparing.  Plan:  The patient will continue to follow with urology for ongoing PSA determinations. I would anticipate a high likelihood for local tumor control with minimal risk for rectal morbidity.  ________________________________  [name] Tammi Klippel, M.D.

## 2021-05-22 ENCOUNTER — Telehealth: Payer: Self-pay | Admitting: *Deleted

## 2021-05-27 ENCOUNTER — Telehealth: Payer: Self-pay | Admitting: *Deleted

## 2021-07-22 ENCOUNTER — Telehealth: Payer: Self-pay | Admitting: *Deleted

## 2021-07-31 ENCOUNTER — Telehealth: Payer: Self-pay | Admitting: *Deleted

## 2021-08-14 ENCOUNTER — Inpatient Hospital Stay: Payer: Medicare Other | Attending: Urology | Admitting: *Deleted

## 2021-08-14 ENCOUNTER — Encounter: Payer: Self-pay | Admitting: *Deleted

## 2021-08-14 DIAGNOSIS — C61 Malignant neoplasm of prostate: Secondary | ICD-10-CM

## 2021-08-14 NOTE — Progress Notes (Signed)
2 Identifiers were used during this visit for verification purposes only. Vital signs were not taken due to this being a telephone visit. Pt says, "he thinks he is doing much better". Most of pt's side effects from treatment have resolved. Pt seldom have as much urgency and hesitancy with urination as he once did. He gets up once to bathroom at bedtime. He says he sleeps well.States urine flow is good. Bowels are back to normal but does have more flatus than before.Last colonoscopy was in 2022 by Dr. Watt Climes. Pt does have a hx of polyps. Pt  does not have a hx of smoking. Pt doesn't exercise regularly but gets a few steps in at work. Due to hip problem, he's not exercising much. I will send out some stretches he can do through the [employer].

## 2022-09-03 IMAGING — DX DG CHEST 2V
2 series · 2 of 2 positions shown · non-contrast
Comparison: None.

CLINICAL DATA: Preop radioactive seed prostate surgery.

EXAM:
CHEST - 2 VIEW

[chest pa]
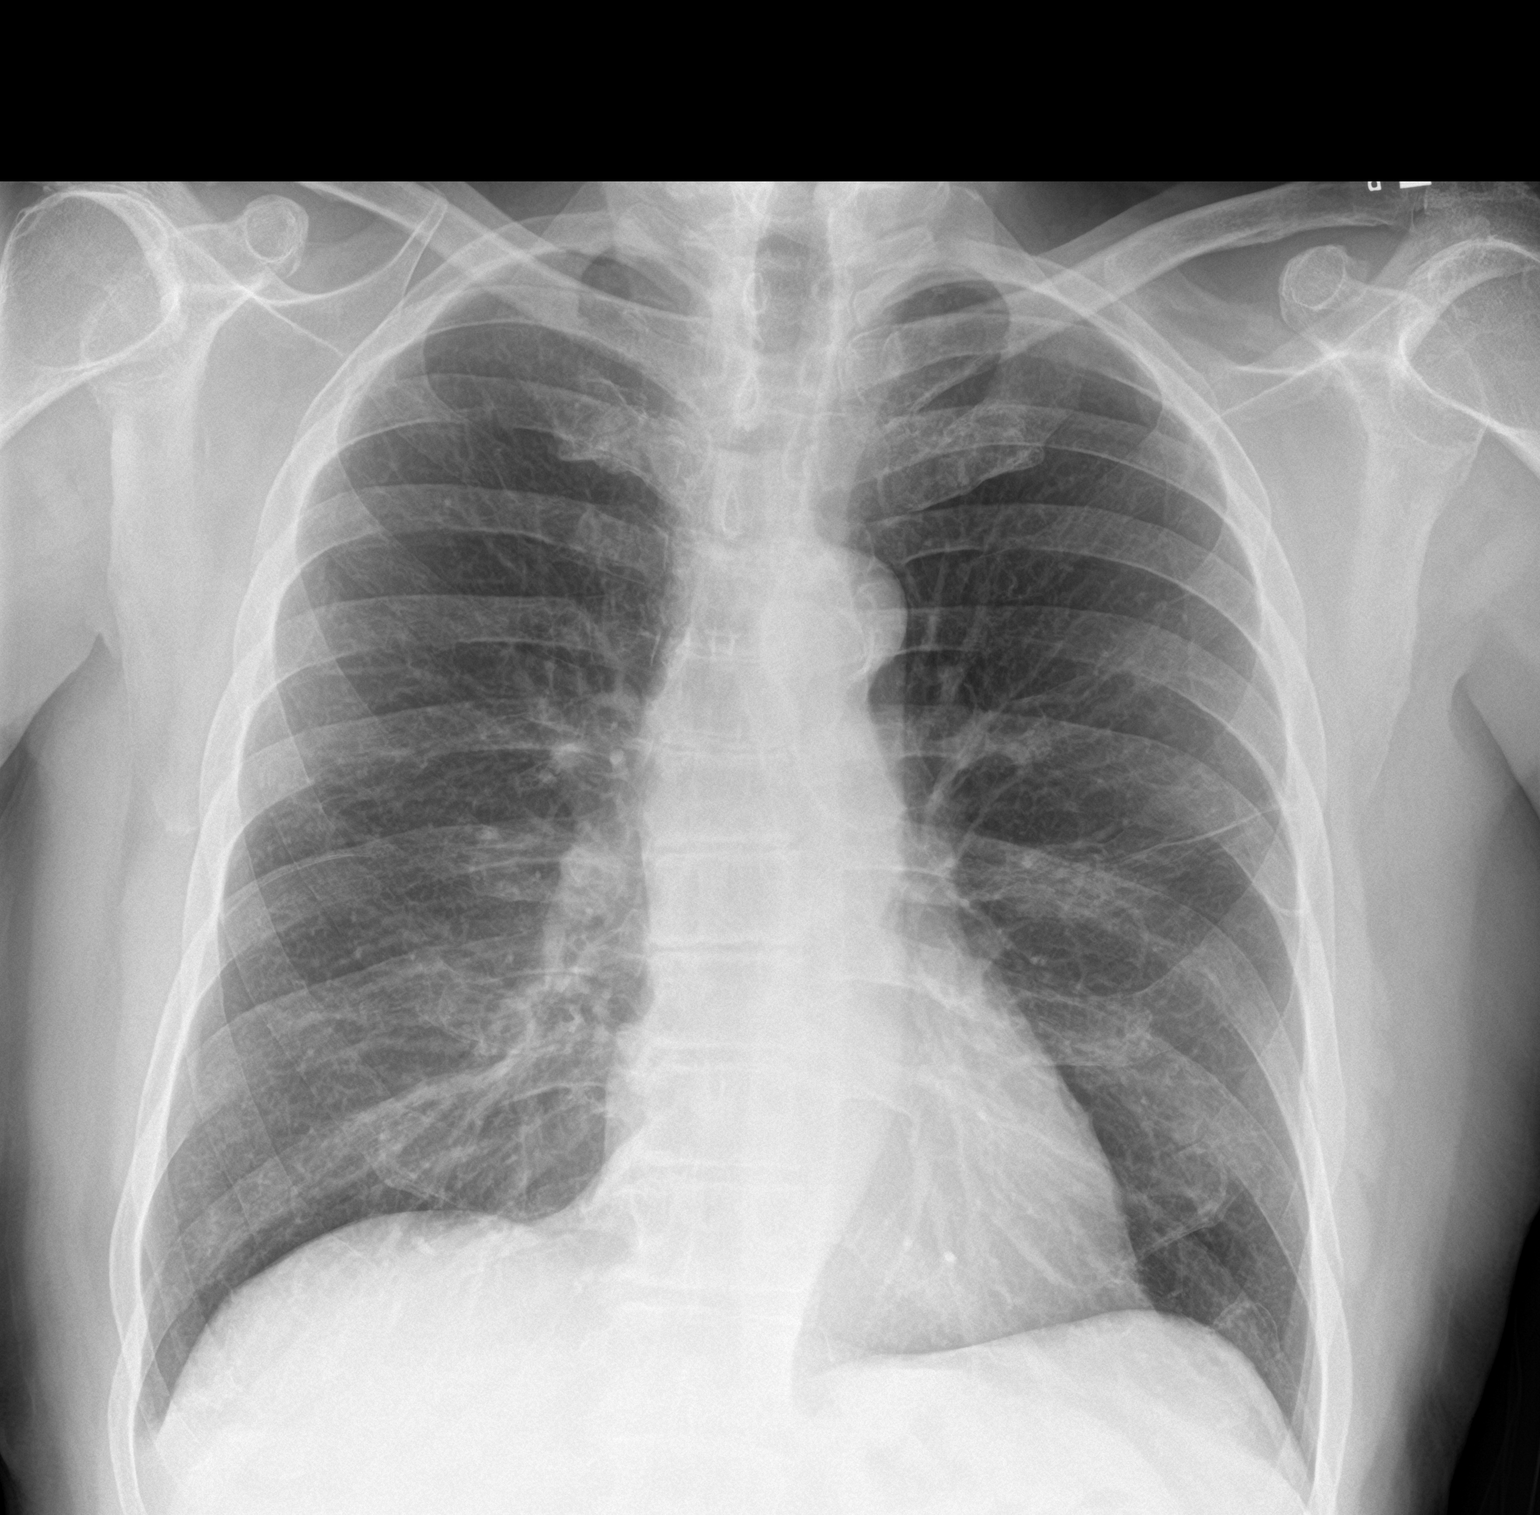

[chest lat]
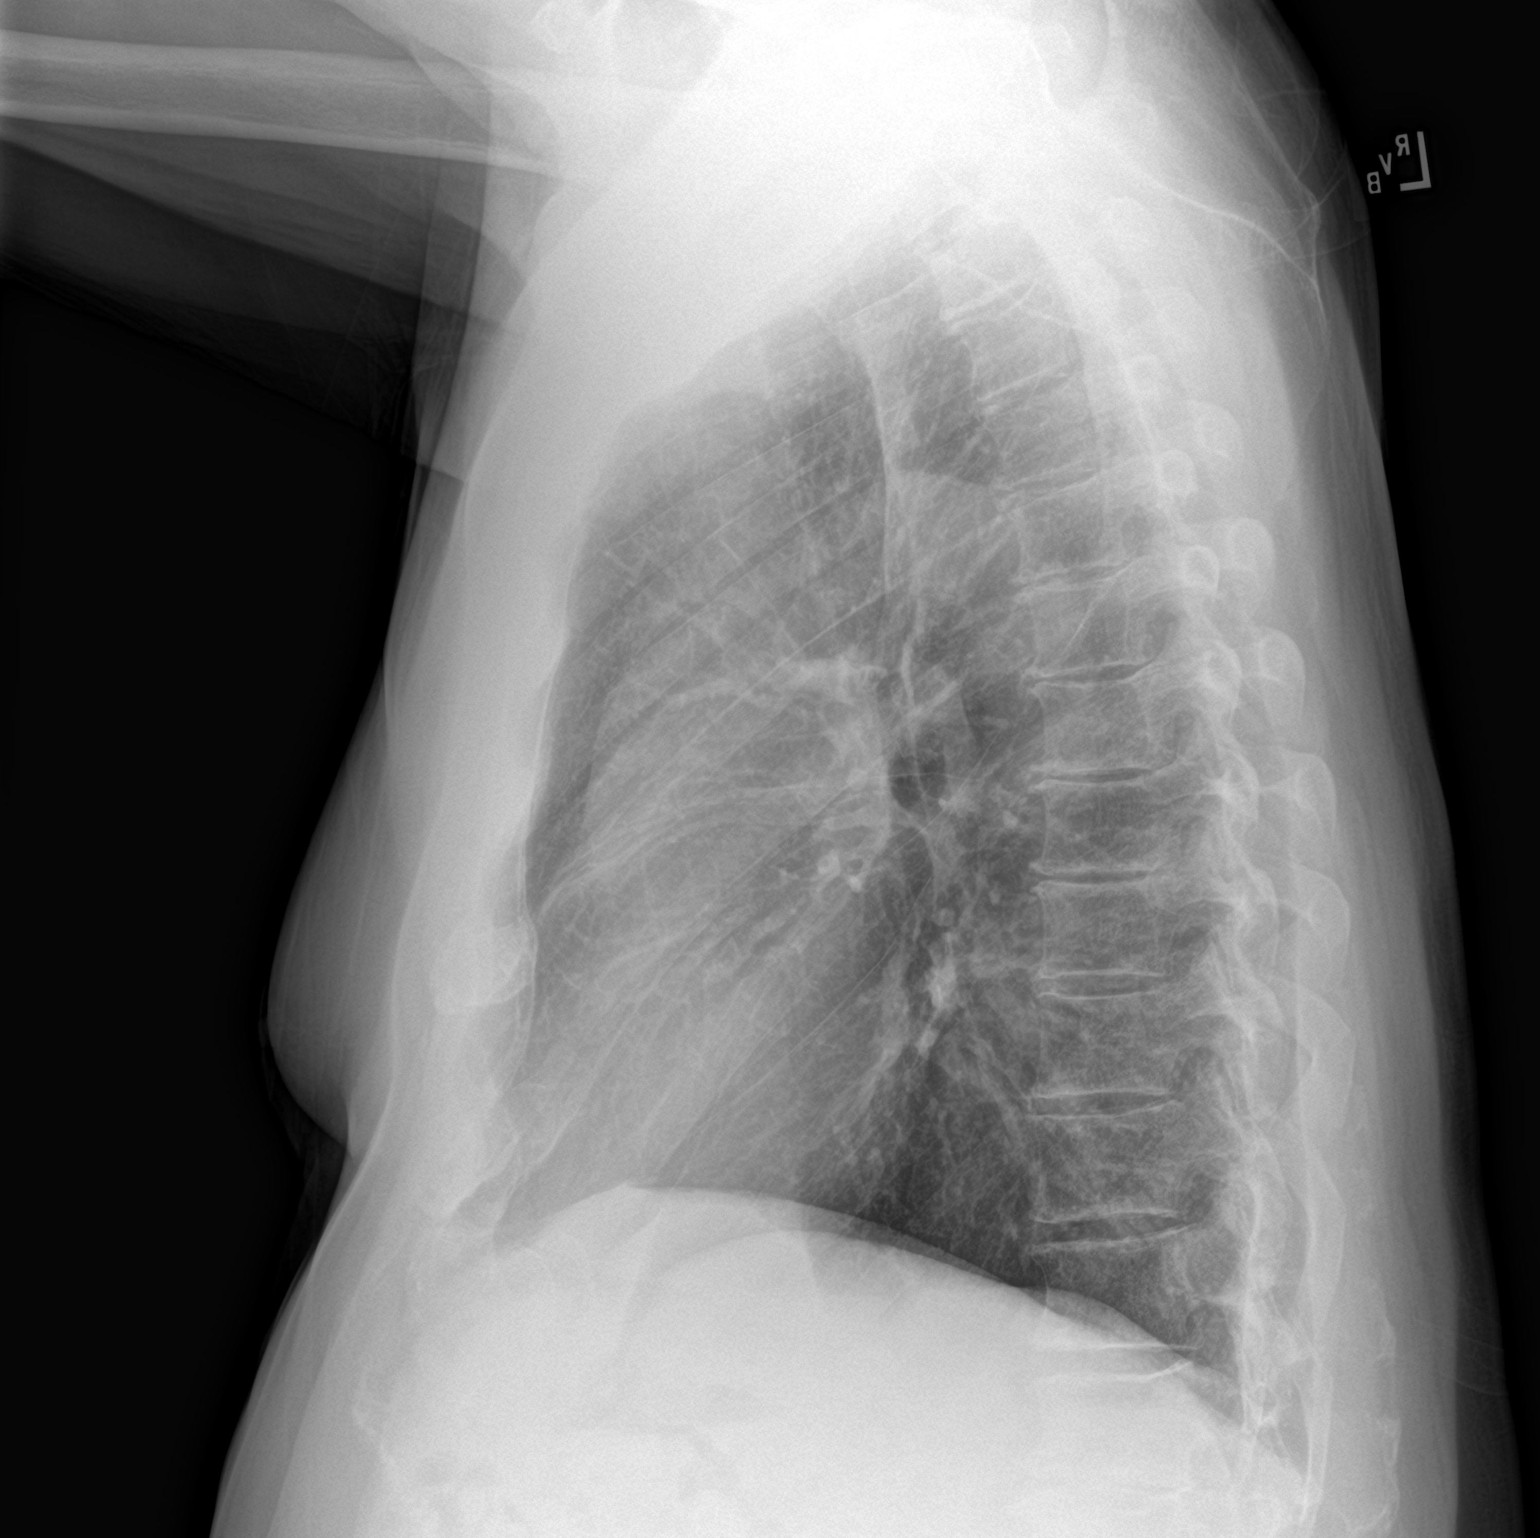

[2 of 2 positions shown; findings below may reference images not displayed]

FINDINGS: The heart size and mediastinal contours are within normal limits.
Calcific atherosclerosis of the aorta. Linear opacity in the left
left midlung, compatible with atelectasis/scar. No consolidation. No
visible pleural effusions or pneumothorax. No acute osseous
abnormality. Polyarticular degenerative change.
IMPRESSION: Linear atelectasis/scarring in the left midlung. Otherwise, no acute
cardiopulmonary disease.

## 2022-11-16 DIAGNOSIS — I480 Paroxysmal atrial fibrillation: Secondary | ICD-10-CM | POA: Insufficient documentation

## 2022-11-16 HISTORY — DX: Paroxysmal atrial fibrillation: I48.0

## 2022-11-29 DIAGNOSIS — R7989 Other specified abnormal findings of blood chemistry: Secondary | ICD-10-CM | POA: Diagnosis not present

## 2022-11-29 DIAGNOSIS — I517 Cardiomegaly: Secondary | ICD-10-CM

## 2022-11-29 DIAGNOSIS — R001 Bradycardia, unspecified: Secondary | ICD-10-CM | POA: Diagnosis not present

## 2022-11-29 DIAGNOSIS — R079 Chest pain, unspecified: Secondary | ICD-10-CM

## 2022-11-29 DIAGNOSIS — I4891 Unspecified atrial fibrillation: Secondary | ICD-10-CM | POA: Diagnosis not present

## 2023-01-15 DIAGNOSIS — R1032 Left lower quadrant pain: Secondary | ICD-10-CM

## 2023-01-15 DIAGNOSIS — K5730 Diverticulosis of large intestine without perforation or abscess without bleeding: Secondary | ICD-10-CM

## 2023-01-15 DIAGNOSIS — K921 Melena: Secondary | ICD-10-CM

## 2023-01-15 DIAGNOSIS — Z860100 Personal history of colon polyps, unspecified: Secondary | ICD-10-CM

## 2023-01-15 DIAGNOSIS — K2100 Gastro-esophageal reflux disease with esophagitis, without bleeding: Secondary | ICD-10-CM

## 2023-01-15 HISTORY — DX: Gastro-esophageal reflux disease with esophagitis, without bleeding: K21.00

## 2023-01-15 HISTORY — DX: Left lower quadrant pain: R10.32

## 2023-01-15 HISTORY — DX: Personal history of colon polyps, unspecified: Z86.0100

## 2023-01-15 HISTORY — DX: Diverticulosis of large intestine without perforation or abscess without bleeding: K57.30

## 2023-01-15 HISTORY — DX: Melena: K92.1

## 2023-01-20 ENCOUNTER — Encounter: Payer: Self-pay | Admitting: Cardiology

## 2023-01-20 NOTE — Progress Notes (Unsigned)
Cardiology Office Note:    Date:  01/21/2023   ID:  Gerald Rogers., DOB 05/03/1946, MRN [id number]  PCP:  Ailene Ravel, MD   [facility] Providers Cardiologist:  None     Referring MD: Ailene Ravel, MD   CC: hospital follow up   History of Present Illness:    Gerald Connelley. is a 77 y.o. male with a hx of prostate cancer, GERD, PAF.   Admitted to [facility] on 11/29/22 with palpitations, found to be in AF RVR, converted with IV cardizem. Echo with normal EF, moderate LVH, trivial MR. CHADS-VASc score of 2, Xarelto 20 mg daily was started. TSH 4.70.   He presents today posthospitalization for follow-up of his atrial fibrillation.  He states he has been doing well from a cardiac perspective, endorses occasional palpitations.  States he noticed these palpitations in the past however did not associate them with the potential of atrial fibrillation.  He is potentially going to undergo a partial hip replacement in the future, states he may need a stress test for this.  We discussed arranging this today however he may want to reach out to a another cardiologist that his friend strongly recommends. He denies chest pain, palpitations, dyspnea, pnd, orthopnea, n, v, dizziness, syncope, edema, weight gain, or early satiety.  He denies hematochezia, hematuria, hemoptysis.  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   Gastro-esophageal reflux disease with esophagitis 01/15/2023   GERD (gastroesophageal reflux disease)    Hematochezia 01/15/2023   History of 2019 novel coronavirus disease (COVID-19) 08/2020   per pt mild symptoms that resolved   Hx of colonic polyp 01/15/2023   Left lower quadrant pain 01/15/2023   Malignant neoplasm of prostate (HCC) 12/06/2020   Paroxysmal atrial fibrillation (HCC) 11/2022   CHADS-VASc 2   Prostate cancer [facility])    urologist-- dr Retta Diones---  dx 01/ 2022, Gleason 3+4   Wears  glasses     Past Surgical History:  Procedure Laterality Date   COLONOSCOPY  2021   last one   PROSTATE BIOPSY     RADIOACTIVE SEED IMPLANT N/A 03/31/2021   Procedure: RADIOACTIVE SEED IMPLANT/BRACHYTHERAPY IMPLANT;  Surgeon: Marcine Matar, MD;  Location: [facility];  Service: Urology;  Laterality: N/A;  63 SEEDS   SPACE OAR INSTILLATION N/A 03/31/2021   Procedure: SPACE OAR INSTILLATION;  Surgeon: Marcine Matar, MD;  Location: [facility];  Service: Urology;  Laterality: N/A;   TONSILLECTOMY     age 74   TOTAL HIP ARTHROPLASTY Right 2010    Current Medications: Current Meds  Medication Sig   acetaminophen (TYLENOL) 500 MG tablet Take 500 mg by mouth every 6 (six) hours as needed for mild pain or moderate pain.   cholecalciferol (VITAMIN D3) 25 MCG (1000 UNIT) tablet Take 1,000 Units by mouth daily.   METOPROLOL SUCCINATE PO Take 1 tablet by mouth in the morning.   Multiple Vitamin (MULTIVITAMIN) tablet Take 1 tablet by mouth daily.   Omega-3 Fatty Acids (FISH OIL PO) Take 1 capsule by mouth daily.   QUERCETIN PO Take 1 tablet by mouth daily.   Rivaroxaban (XARELTO PO) Take 1 tablet by mouth every evening.   Turmeric (CURCUMIN 95 PO) Take 1 capsule by mouth daily.   zinc sulfate 220 (50 Zn) MG capsule Take 220 mg by mouth daily.     Allergies:   Patient has no known  allergies.   Social History   Socioeconomic History   Marital status: Widowed    Spouse name: Not on file   Number of children: 2   Years of education: Not on file   Highest education level: Not on file  Occupational History   Not on file  Tobacco Use   Smoking status: Never   Smokeless tobacco: Never  Vaping Use   Vaping Use: Never used  Substance and Sexual Activity   Alcohol use: Yes    Comment: occasional   Drug use: Never   Sexual activity: Yes  Other Topics Concern   Not on file  Social History Narrative   Children - 1 boy and 1 girl   Social  Determinants of Health   Financial Resource Strain: Low Risk  (08/14/2021)   Overall Financial Resource Strain (CARDIA)    Difficulty of Paying Living Expenses: Not hard at all  Food Insecurity: No Food Insecurity (08/14/2021)   Hunger Vital Sign    Worried About Running Out of Food in the Last Year: Never true    Ran Out of Food in the Last Year: Never true  Transportation Needs: No Transportation Needs (08/14/2021)   PRAPARE - Administrator, Civil Service (Medical): No    Lack of Transportation (Non-Medical): No  Physical Activity: Inactive (08/14/2021)   Exercise Vital Sign    Days of Exercise per Week: 0 days    Minutes of Exercise per Session: 0 min  Stress: No Stress Concern Present (08/14/2021)   [employer] of Occupational Health - Occupational Stress Questionnaire    Feeling of Stress : Not at all  Social Connections: Socially Isolated (08/14/2021)   Social Connection and Isolation Panel [NHANES]    Frequency of Communication with Friends and Family: More than three times a week    Frequency of Social Gatherings with Friends and Family: Three times a week    Attends Religious Services: Never    Active Member of Clubs or Organizations: No    Attends Banker Meetings: Never    Marital Status: Widowed     Family History: The patient's family history includes Bladder Cancer in his maternal grandfather; Breast cancer in his cousin and sister; Cancer in his maternal aunt and maternal aunt; Lung cancer in his father. There is no history of Prostate cancer, Colon cancer, or Pancreatic cancer.  ROS:   Please see the history of present illness.     All other systems reviewed and are negative.  EKGs/Labs/Other Studies Reviewed:    The following studies were reviewed today:      EKG:  EKG is  ordered today.  The ekg ordered today demonstrates sinus rhythm, heart rate 69 bpm, left axis deviation.  Recent Labs: No results found for requested labs  within last 365 days.  Recent Lipid Panel No results found for: "CHOL", "TRIG", "HDL", "CHOLHDL", "VLDL", "LDLCALC", "LDLDIRECT"   Risk Assessment/Calculations:    CHA2DS2-VASc Score = 2   This indicates a 2.2% annual risk of stroke. The patient's score is based upon: CHF History: 0 HTN History: 0 Diabetes History: 0 Stroke History: 0 Vascular Disease History: 0 Age Score: 2 Gender Score: 0               Physical Exam:    VS:  BP 112/64 (BP Location: Left Arm, Patient Position: Sitting)   Pulse 69   Ht 5\' 8"  (1.727 m)   Wt 173 lb (78.5 kg)   SpO2 Marland Kitchen)  64%   BMI 26.30 kg/m     Wt Readings from Last 3 Encounters:  01/21/23 173 lb (78.5 kg)  04/23/21 175 lb (79.4 kg)  03/31/21 176 lb 14.4 oz (80.2 kg)     GEN:  Well nourished, well developed in no acute distress HEENT: Normal NECK: No JVD; No carotid bruits LYMPHATICS: No lymphadenopathy CARDIAC: RRR, no murmurs, rubs, gallops RESPIRATORY:  Clear to auscultation without rales, wheezing or rhonchi  ABDOMEN: Soft, non-tender, non-distended MUSCULOSKELETAL:  No edema; No deformity  SKIN: Warm and dry NEUROLOGIC:  Alert and oriented x 3 PSYCHIATRIC:  Normal affect   ASSESSMENT:    1. Paroxysmal atrial fibrillation (HCC)   2. Palpitations   3. Hypercoagulable state (HCC)    PLAN:    In order of problems listed above:  Paroxysmal atrial fibrillation/hypercoagulable state-he is in normal sinus rhythm today.  CHA2DS2-VASc score 2, continue Xarelto 20 mg daily--creatinine clearance 104, no indication for dose reduction.  Continue metoprolol once daily.  Will repeat BMET, CBC today. Palpitations-will arrange for 2-week monitor for atrial fibrillation burden versus other sources of palpitations.  Disposition-CBC BMET today.  2-week monitor.  He did not want to schedule a follow-up visit, possibly wants to establish with Dr. Juliann Pares with [facility], he will contact us if he would like to be seen in the  future.           Medication Adjustments/Labs and Tests Ordered: Current medicines are reviewed at length with the patient today.  Concerns regarding medicines are outlined above.  Orders Placed This Encounter  Procedures   Basic metabolic panel   CBC with Differential/Platelet   LONG TERM MONITOR (3-14 DAYS)   EKG 12-Lead   No orders of the defined types were placed in this encounter.   Patient Instructions  Medication Instructions:  Your physician recommends that you continue on your current medications as directed. Please refer to the Current Medication list given to you today.  *If you need a refill on your cardiac medications before your next appointment, please call your pharmacy*   Lab Work: Your physician recommends that you return for lab work in: Today for a CBC and BMP  If you have labs (blood work) drawn today and your tests are completely normal, you will receive your results only by: MyChart Message (if you have MyChart) OR A paper copy in the mail If you have any lab test that is abnormal or we need to change your treatment, we will call you to review the results.   Testing/Procedures: You have been asked to wear a Zio Heart Monitor today. It is to be worn for 14 days. Please remove the monitor on 6/20 and mail back in the box provided.  If you have any questions about the monitor please call the company at [phone]     Follow-Up: At [facility], you and your health needs are our priority.  As part of our continuing mission to provide you with exceptional heart care, we have created designated Provider Care Teams.  These Care Teams include your primary Cardiologist (physician) and Advanced Practice Providers (APPs -  Physician Assistants and Nurse Practitioners) who all work together to provide you with the care you need, when you need it.  We recommend signing up for the patient portal called "MyChart".  Sign up information is provided on  this After Visit Summary.  MyChart is used to connect with patients for Virtual Visits (Telemedicine).  Patients are able to view lab/test  results, encounter notes, upcoming appointments, etc.  Non-urgent messages can be sent to your provider as well.   To learn more about what you can do with MyChart, go to [email or web address].    Your next appointment:  Pt will let us know    Provider:    Other Instructions  Heart-Healthy Eating Plan Eating a healthy diet is important for the health of your heart. A heart-healthy eating plan includes: Eating less unhealthy fats. Eating more healthy fats. Eating less salt in your food. Salt is also called sodium. Making other changes in your diet. Talk with your doctor or a diet specialist (dietitian) to create an eating plan that is right for you. What is my plan? Your doctor may recommend an eating plan that includes: Total fat: ______% or less of total calories a day. Saturated fat: ______% or less of total calories a day. Cholesterol: less than _________mg a day. Sodium: less than _________mg a day. What are tips for following this plan? Cooking Avoid frying your food. Try to bake, boil, grill, or broil it instead. You can also reduce fat by: Removing the skin from poultry. Removing all visible fats from meats. Steaming vegetables in water or broth. Meal planning  At meals, divide your plate into four equal parts: Fill one-half of your plate with vegetables and green salads. Fill one-fourth of your plate with whole grains. Fill one-fourth of your plate with lean protein foods. Eat 2-4 cups of vegetables per day. One cup of vegetables is: 1 cup (91 g) broccoli or cauliflower florets. 2 medium carrots. 1 large bell pepper. 1 large sweet potato. 1 large tomato. 1 medium white potato. 2 cups (150 g) raw leafy greens. Eat 1-2 cups of fruit per day. One cup of fruit is: 1 small apple 1 large banana 1 cup (237 g) mixed fruit, 1  large orange,  cup (82 g) dried fruit, 1 cup (240 mL) 100% fruit juice. Eat more foods that have soluble fiber. These are apples, broccoli, carrots, beans, peas, and barley. Try to get 20-30 g of fiber per day. Eat 4-5 servings of nuts, legumes, and seeds per week: 1 serving of dried beans or legumes equals  cup (90 g) cooked. 1 serving of nuts is  oz (12 almonds, 24 pistachios, or 7 walnut halves). 1 serving of seeds equals  oz (8 g). General information Eat more home-cooked food. Eat less restaurant, buffet, and fast food. Limit or avoid alcohol. Limit foods that are high in starch and sugar. Avoid fried foods. Lose weight if you are overweight. Keep track of how much salt (sodium) you eat. This is important if you have high blood pressure. Ask your doctor to tell you more about this. Try to add vegetarian meals each week. Fats Choose healthy fats. These include olive oil and canola oil, flaxseeds, walnuts, almonds, and seeds. Eat more omega-3 fats. These include salmon, mackerel, sardines, tuna, flaxseed oil, and ground flaxseeds. Try to eat fish at least 2 times each week. Check food labels. Avoid foods with trans fats or high amounts of saturated fat. Limit saturated fats. These are often found in animal products, such as meats, butter, and cream. These are also found in plant foods, such as palm oil, palm kernel oil, and coconut oil. Avoid foods with partially hydrogenated oils in them. These have trans fats. Examples are stick margarine, some tub margarines, cookies, crackers, and other baked goods. What foods should I eat? Fruits All fresh, canned (in natural juice), or frozen  fruits. Vegetables Fresh or frozen vegetables (raw, steamed, roasted, or grilled). Green salads. Grains Most grains. Choose whole wheat and whole grains most of the time. Rice and pasta, including brown rice and pastas made with whole wheat. Meats and other proteins Lean, well-trimmed beef, veal,  pork, and lamb. Chicken and [place] without skin. All fish and shellfish. Wild duck, rabbit, pheasant, and venison. Egg whites or low-cholesterol egg substitutes. Dried beans, peas, lentils, and tofu. Seeds and most nuts. Dairy Low-fat or nonfat cheeses, including ricotta and mozzarella. Skim or 1% milk that is liquid, powdered, or evaporated. Buttermilk that is made with low-fat milk. Nonfat or low-fat yogurt. Fats and oils Non-hydrogenated (trans-free) margarines. Vegetable oils, including soybean, sesame, sunflower, olive, peanut, safflower, corn, canola, and cottonseed. Salad dressings or mayonnaise made with a vegetable oil. Beverages Mineral water. Coffee and tea. Diet carbonated beverages. Sweets and desserts Sherbet, gelatin, and fruit ice. Small amounts of dark chocolate. Limit all sweets and desserts. Seasonings and condiments All seasonings and condiments. The items listed above may not be a complete list of foods and drinks you can eat. Contact a dietitian for more options. What foods should I avoid? Fruits Canned fruit in heavy syrup. Fruit in cream or butter sauce. Fried fruit. Limit coconut. Vegetables Vegetables cooked in cheese, cream, or butter sauce. Fried vegetables. Grains Breads that are made with saturated or trans fats, oils, or whole milk. Croissants. Sweet rolls. Donuts. High-fat crackers, such as cheese crackers. Meats and other proteins Fatty meats, such as hot dogs, ribs, sausage, bacon, rib-eye roast or steak. High-fat deli meats, such as salami and bologna. Caviar. Domestic duck and goose. Organ meats, such as liver. Dairy Cream, sour cream, cream cheese, and creamed cottage cheese. Whole-milk cheeses. Whole or 2% milk that is liquid, evaporated, or condensed. Whole buttermilk. Cream sauce or high-fat cheese sauce. Yogurt that is made from whole milk. Fats and oils Meat fat, or shortening. Cocoa butter, hydrogenated oils, palm oil, coconut oil, palm kernel oil.  Solid fats and shortenings, including bacon fat, salt pork, lard, and butter. Nondairy cream substitutes. Salad dressings with cheese or sour cream. Beverages Regular sodas and juice drinks with added sugar. Sweets and desserts Frosting. Pudding. Cookies. Cakes. Pies. Milk chocolate or white chocolate. Buttered syrups. Full-fat ice cream or ice cream drinks. The items listed above may not be a complete list of foods and drinks to avoid. Contact a dietitian for more information. Summary Heart-healthy meal planning includes eating less unhealthy fats, eating more healthy fats, and making other changes in your diet. Eat a balanced diet. This includes fruits and vegetables, low-fat or nonfat dairy, lean protein, nuts and legumes, whole grains, and heart-healthy oils and fats. This information is not intended to replace advice given to you by your health care provider. Make sure you discuss any questions you have with your health care provider. Document Revised: 09/08/2021 Document Reviewed: 09/08/2021 Elsevier Patient Education  [address].    Signed, Flossie Dibble, NP  01/21/2023 12:19 PM    [facility]

## 2023-01-21 ENCOUNTER — Encounter: Payer: Self-pay | Admitting: Cardiology

## 2023-01-21 ENCOUNTER — Ambulatory Visit: Payer: Medicare Other | Attending: Cardiology | Admitting: Cardiology

## 2023-01-21 ENCOUNTER — Ambulatory Visit: Payer: Medicare Other | Attending: Cardiology

## 2023-01-21 VITALS — BP 112/64 | HR 69 | Ht 68.0 in | Wt 173.0 lb

## 2023-01-21 DIAGNOSIS — D6859 Other primary thrombophilia: Secondary | ICD-10-CM

## 2023-01-21 DIAGNOSIS — I480 Paroxysmal atrial fibrillation: Secondary | ICD-10-CM

## 2023-01-21 DIAGNOSIS — R002 Palpitations: Secondary | ICD-10-CM | POA: Diagnosis not present

## 2023-01-21 NOTE — Patient Instructions (Addendum)
Medication Instructions:  Your physician recommends that you continue on your current medications as directed. Please refer to the Current Medication list given to you today.  *If you need a refill on your cardiac medications before your next appointment, please call your pharmacy*   Lab Work: Your physician recommends that you return for lab work in: Today for a CBC and BMP  If you have labs (blood work) drawn today and your tests are completely normal, you will receive your results only by: MyChart Message (if you have MyChart) OR A paper copy in the mail If you have any lab test that is abnormal or we need to change your treatment, we will call you to review the results.   Testing/Procedures: You have been asked to wear a Zio Heart Monitor today. It is to be worn for 14 days. Please remove the monitor on 6/20 and mail back in the box provided.  If you have any questions about the monitor please call the company at [phone]     Follow-Up: At [facility], you and your health needs are our priority.  As part of our continuing mission to provide you with exceptional heart care, we have created designated Provider Care Teams.  These Care Teams include your primary Cardiologist (physician) and Advanced Practice Providers (APPs -  Physician Assistants and Nurse Practitioners) who all work together to provide you with the care you need, when you need it.  We recommend signing up for the patient portal called "MyChart".  Sign up information is provided on this After Visit Summary.  MyChart is used to connect with patients for Virtual Visits (Telemedicine).  Patients are able to view lab/test results, encounter notes, upcoming appointments, etc.  Non-urgent messages can be sent to your provider as well.   To learn more about what you can do with MyChart, go to [email or web address].    Your next appointment:  Pt will let us know    Provider:    Other Instructions   Heart-Healthy Eating Plan Eating a healthy diet is important for the health of your heart. A heart-healthy eating plan includes: Eating less unhealthy fats. Eating more healthy fats. Eating less salt in your food. Salt is also called sodium. Making other changes in your diet. Talk with your doctor or a diet specialist (dietitian) to create an eating plan that is right for you. What is my plan? Your doctor may recommend an eating plan that includes: Total fat: ______% or less of total calories a day. Saturated fat: ______% or less of total calories a day. Cholesterol: less than _________mg a day. Sodium: less than _________mg a day. What are tips for following this plan? Cooking Avoid frying your food. Try to bake, boil, grill, or broil it instead. You can also reduce fat by: Removing the skin from poultry. Removing all visible fats from meats. Steaming vegetables in water or broth. Meal planning  At meals, divide your plate into four equal parts: Fill one-half of your plate with vegetables and green salads. Fill one-fourth of your plate with whole grains. Fill one-fourth of your plate with lean protein foods. Eat 2-4 cups of vegetables per day. One cup of vegetables is: 1 cup (91 g) broccoli or cauliflower florets. 2 medium carrots. 1 large bell pepper. 1 large sweet potato. 1 large tomato. 1 medium white potato. 2 cups (150 g) raw leafy greens. Eat 1-2 cups of fruit per day. One cup of fruit is: 1 small apple 1 large banana 1  cup (237 g) mixed fruit, 1 large orange,  cup (82 g) dried fruit, 1 cup (240 mL) 100% fruit juice. Eat more foods that have soluble fiber. These are apples, broccoli, carrots, beans, peas, and barley. Try to get 20-30 g of fiber per day. Eat 4-5 servings of nuts, legumes, and seeds per week: 1 serving of dried beans or legumes equals  cup (90 g) cooked. 1 serving of nuts is  oz (12 almonds, 24 pistachios, or 7 walnut halves). 1 serving of seeds  equals  oz (8 g). General information Eat more home-cooked food. Eat less restaurant, buffet, and fast food. Limit or avoid alcohol. Limit foods that are high in starch and sugar. Avoid fried foods. Lose weight if you are overweight. Keep track of how much salt (sodium) you eat. This is important if you have high blood pressure. Ask your doctor to tell you more about this. Try to add vegetarian meals each week. Fats Choose healthy fats. These include olive oil and canola oil, flaxseeds, walnuts, almonds, and seeds. Eat more omega-3 fats. These include salmon, mackerel, sardines, tuna, flaxseed oil, and ground flaxseeds. Try to eat fish at least 2 times each week. Check food labels. Avoid foods with trans fats or high amounts of saturated fat. Limit saturated fats. These are often found in animal products, such as meats, butter, and cream. These are also found in plant foods, such as palm oil, palm kernel oil, and coconut oil. Avoid foods with partially hydrogenated oils in them. These have trans fats. Examples are stick margarine, some tub margarines, cookies, crackers, and other baked goods. What foods should I eat? Fruits All fresh, canned (in natural juice), or frozen fruits. Vegetables Fresh or frozen vegetables (raw, steamed, roasted, or grilled). Green salads. Grains Most grains. Choose whole wheat and whole grains most of the time. Rice and pasta, including brown rice and pastas made with whole wheat. Meats and other proteins Lean, well-trimmed beef, veal, pork, and lamb. Chicken and [place] without skin. All fish and shellfish. Wild duck, rabbit, pheasant, and venison. Egg whites or low-cholesterol egg substitutes. Dried beans, peas, lentils, and tofu. Seeds and most nuts. Dairy Low-fat or nonfat cheeses, including ricotta and mozzarella. Skim or 1% milk that is liquid, powdered, or evaporated. Buttermilk that is made with low-fat milk. Nonfat or low-fat yogurt. Fats and  oils Non-hydrogenated (trans-free) margarines. Vegetable oils, including soybean, sesame, sunflower, olive, peanut, safflower, corn, canola, and cottonseed. Salad dressings or mayonnaise made with a vegetable oil. Beverages Mineral water. Coffee and tea. Diet carbonated beverages. Sweets and desserts Sherbet, gelatin, and fruit ice. Small amounts of dark chocolate. Limit all sweets and desserts. Seasonings and condiments All seasonings and condiments. The items listed above may not be a complete list of foods and drinks you can eat. Contact a dietitian for more options. What foods should I avoid? Fruits Canned fruit in heavy syrup. Fruit in cream or butter sauce. Fried fruit. Limit coconut. Vegetables Vegetables cooked in cheese, cream, or butter sauce. Fried vegetables. Grains Breads that are made with saturated or trans fats, oils, or whole milk. Croissants. Sweet rolls. Donuts. High-fat crackers, such as cheese crackers. Meats and other proteins Fatty meats, such as hot dogs, ribs, sausage, bacon, rib-eye roast or steak. High-fat deli meats, such as salami and bologna. Caviar. Domestic duck and goose. Organ meats, such as liver. Dairy Cream, sour cream, cream cheese, and creamed cottage cheese. Whole-milk cheeses. Whole or 2% milk that is liquid, evaporated, or condensed. Whole  buttermilk. Cream sauce or high-fat cheese sauce. Yogurt that is made from whole milk. Fats and oils Meat fat, or shortening. Cocoa butter, hydrogenated oils, palm oil, coconut oil, palm kernel oil. Solid fats and shortenings, including bacon fat, salt pork, lard, and butter. Nondairy cream substitutes. Salad dressings with cheese or sour cream. Beverages Regular sodas and juice drinks with added sugar. Sweets and desserts Frosting. Pudding. Cookies. Cakes. Pies. Milk chocolate or white chocolate. Buttered syrups. Full-fat ice cream or ice cream drinks. The items listed above may not be a complete list of foods  and drinks to avoid. Contact a dietitian for more information. Summary Heart-healthy meal planning includes eating less unhealthy fats, eating more healthy fats, and making other changes in your diet. Eat a balanced diet. This includes fruits and vegetables, low-fat or nonfat dairy, lean protein, nuts and legumes, whole grains, and heart-healthy oils and fats. This information is not intended to replace advice given to you by your health care provider. Make sure you discuss any questions you have with your health care provider. Document Revised: 09/08/2021 Document Reviewed: 09/08/2021 Elsevier Patient Education  2024 [employer].

## 2023-01-22 ENCOUNTER — Other Ambulatory Visit: Payer: Self-pay

## 2023-01-22 LAB — BASIC METABOLIC PANEL WITH GFR
BUN/Creatinine Ratio: 18 (ref 10–24)
BUN: 14 mg/dL (ref 8–27)
CO2: 24 mmol/L (ref 20–29)
Calcium: 9.7 mg/dL (ref 8.6–10.2)
Chloride: 101 mmol/L (ref 96–106)
Creatinine, Ser: 0.8 mg/dL (ref 0.76–1.27)
Glucose: 95 mg/dL (ref 70–99)
Potassium: 4 mmol/L (ref 3.5–5.2)
Sodium: 136 mmol/L (ref 134–144)
eGFR: 91 mL/min/1.73

## 2023-01-22 LAB — CBC WITH DIFFERENTIAL/PLATELET
Basophils Absolute: 0.1 x10E3/uL (ref 0.0–0.2)
Basos: 1 %
EOS (ABSOLUTE): 0.6 x10E3/uL — ABNORMAL HIGH (ref 0.0–0.4)
Eos: 9 %
Hematocrit: 43.9 % (ref 37.5–51.0)
Hemoglobin: 14.7 g/dL (ref 13.0–17.7)
Immature Grans (Abs): 0 x10E3/uL (ref 0.0–0.1)
Immature Granulocytes: 0 %
Lymphocytes Absolute: 1.6 x10E3/uL (ref 0.7–3.1)
Lymphs: 24 %
MCH: 28.8 pg (ref 26.6–33.0)
MCHC: 33.5 g/dL (ref 31.5–35.7)
MCV: 86 fL (ref 79–97)
Monocytes Absolute: 0.7 x10E3/uL (ref 0.1–0.9)
Monocytes: 10 %
Neutrophils Absolute: 3.8 x10E3/uL (ref 1.4–7.0)
Neutrophils: 56 %
Platelets: 283 x10E3/uL (ref 150–450)
RBC: 5.1 x10E6/uL (ref 4.14–5.80)
RDW: 13.8 % (ref 11.6–15.4)
WBC: 6.8 x10E3/uL (ref 3.4–10.8)

## 2023-01-26 ENCOUNTER — Telehealth: Payer: Self-pay | Admitting: Cardiology

## 2023-01-26 NOTE — Telephone Encounter (Signed)
Pt would like a callback regarding Heart Monitor. Pt states that he was only able to wear monitor for 36 hrs before it fell off. Please advise

## 2023-01-26 NOTE — Telephone Encounter (Signed)
Called patient and he reported that his heart monitor had fallen off after wearing it for 36 hours. I informed him that he needed to call the phone number on the white Zio pamphlet that he was given when the heart monitor was put on. Patient stated that he would call the phone number on the front of the white Zio pamphlet for guidance on what he should do next. Patient had no further questions at this time.

## 2023-02-04 ENCOUNTER — Telehealth: Payer: Self-pay | Admitting: *Deleted

## 2023-02-04 ENCOUNTER — Other Ambulatory Visit: Payer: Self-pay | Admitting: *Deleted

## 2023-02-04 DIAGNOSIS — I480 Paroxysmal atrial fibrillation: Secondary | ICD-10-CM

## 2023-02-04 DIAGNOSIS — R002 Palpitations: Secondary | ICD-10-CM

## 2023-02-04 NOTE — Telephone Encounter (Signed)
Pt returning call

## 2023-02-04 NOTE — Telephone Encounter (Signed)
Spoke with pt about Zio monitor. Pt stated it had fallen off from sweating and also that it has irritated his skin. Checked with Wallis Bamberg NP and she still would like a monitor done. We will apply a Preventice monitor instead and see if it works better. Pt will call before coming to get that monitor done.

## 2023-02-04 NOTE — Addendum Note (Signed)
Addended by: Roosvelt Harps R on: 02/04/2023 11:53 AM   Modules accepted: Orders

## 2023-02-04 NOTE — Telephone Encounter (Signed)
Called pt to inquire about the heart monitor he wore. It fell off after 1 day and need to know if company sent him another one. Unable to leave message.

## 2023-03-09 NOTE — Telephone Encounter (Signed)
Spoke with pt in length about monitor. He would like to wait until it is a little cooler to wear another monitor. We will put the 30 day event monitor on him this time with the strips for sensitive skin. When he is ready to try he will call us and set up a monitor visit.

## 2024-03-13 ENCOUNTER — Ambulatory Visit: Admitting: Cardiology

## 2024-03-16 DIAGNOSIS — M1990 Unspecified osteoarthritis, unspecified site: Secondary | ICD-10-CM | POA: Insufficient documentation

## 2024-03-16 DIAGNOSIS — K219 Gastro-esophageal reflux disease without esophagitis: Secondary | ICD-10-CM | POA: Insufficient documentation

## 2024-03-16 DIAGNOSIS — Z973 Presence of spectacles and contact lenses: Secondary | ICD-10-CM | POA: Insufficient documentation

## 2024-03-16 DIAGNOSIS — C61 Malignant neoplasm of prostate: Secondary | ICD-10-CM | POA: Insufficient documentation

## 2024-03-17 ENCOUNTER — Ambulatory Visit: Attending: Cardiology | Admitting: Cardiology

## 2024-03-17 ENCOUNTER — Encounter: Payer: Self-pay | Admitting: Cardiology

## 2024-03-17 VITALS — BP 110/72 | HR 77 | Ht 68.0 in | Wt 170.0 lb

## 2024-03-17 DIAGNOSIS — C61 Malignant neoplasm of prostate: Secondary | ICD-10-CM

## 2024-03-17 DIAGNOSIS — I480 Paroxysmal atrial fibrillation: Secondary | ICD-10-CM

## 2024-03-17 DIAGNOSIS — Z01810 Encounter for preprocedural cardiovascular examination: Secondary | ICD-10-CM

## 2024-03-17 NOTE — Progress Notes (Signed)
 Cardiology Office Note:    Date:  03/17/2024   ID:  Gerald Baratta., DOB 12-Apr-1946, MRN [id number]  PCP:  Stephanie Charlene CROME, MD  Cardiologist:  Lamar Fitch, MD    Referring MD: Stephanie Charlene CROME, MD   Chief Complaint  Patient presents with   Follow-up    History of Present Illness:    Gerald Ishler. is a 78 y.o. male past medical history significant for paroxysmal atrial fibrillation, echocardiogram showing normal ejection fraction moderate LVH trivial mitral regurgitation, chest Vascor equals 2, anticoagulated, was seen by us  last time a year ago.  He has been put on Xarelto as well as metoprolol doing great.  Comes for regular follow-up on top of that he does have left hip problem and he required hip surgery he is not scheduled for the surgery but would like to be evaluated before the surgery obviously his ability to exercise is limited because of problems with his hip.  He described some fatigue shortness of breath but no chest pain tightness squeezing pressure burning chest.  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   Gastro-esophageal reflux disease with esophagitis 01/15/2023   GERD (gastroesophageal reflux disease)    Hematochezia 01/15/2023   History of 2019 novel coronavirus disease (COVID-19) 08/2020   per pt mild symptoms that resolved   Hx of colonic polyp 01/15/2023   Left lower quadrant pain 01/15/2023   Malignant neoplasm of prostate (HCC) 12/06/2020   Paroxysmal atrial fibrillation (HCC) 11/2022   CHADS-VASc 2   Prostate cancer [facility])    urologist-- dr matilda---  dx 01/ 2022, Gleason 3+4   Wears glasses     Past Surgical History:  Procedure Laterality Date   COLONOSCOPY  2021   last one   PROSTATE BIOPSY     RADIOACTIVE SEED IMPLANT N/A 03/31/2021   Procedure: RADIOACTIVE SEED IMPLANT/BRACHYTHERAPY IMPLANT;  Surgeon: matilda Senior, MD;  Location: [facility];  Service:  Urology;  Laterality: N/A;  63 SEEDS   SPACE OAR INSTILLATION N/A 03/31/2021   Procedure: SPACE OAR INSTILLATION;  Surgeon: matilda Senior, MD;  Location: [facility];  Service: Urology;  Laterality: N/A;   TONSILLECTOMY     age 56   TOTAL HIP ARTHROPLASTY Right 2010    Current Medications: Current Meds  Medication Sig   acetaminophen (TYLENOL) 500 MG tablet Take 500 mg by mouth every 6 (six) hours as needed for mild pain or moderate pain.   cholecalciferol (VITAMIN D3) 25 MCG (1000 UNIT) tablet Take 1,000 Units by mouth daily.   metoprolol succinate (TOPROL-XL) 25 MG 24 hr tablet Take 12.5 mg by mouth daily.   Multiple Vitamin (MULTIVITAMIN) tablet Take 1 tablet by mouth daily.   Omega-3 Fatty Acids (FISH OIL PO) Take 1 capsule by mouth daily.   Turmeric (CURCUMIN 95 PO) Take 1 capsule by mouth daily.   XARELTO 20 MG TABS tablet Take 20 mg by mouth daily.   zinc sulfate 220 (50 Zn) MG capsule Take 220 mg by mouth daily.   [DISCONTINUED] METOPROLOL SUCCINATE PO Take 1 tablet by mouth in the morning.     Allergies:   Patient has no known allergies.   Social History   Socioeconomic History   Marital status: Widowed    Spouse name: Not on file   Number of children: 2   Years of education: Not on file   Highest education level: Not on  file  Occupational History   Not on file  Tobacco Use   Smoking status: Never   Smokeless tobacco: Never  Vaping Use   Vaping status: Never Used  Substance and Sexual Activity   Alcohol use: Yes    Comment: occasional   Drug use: Never   Sexual activity: Yes  Other Topics Concern   Not on file  Social History Narrative   Children - 1 boy and 1 girl   Social Drivers of Corporate investment banker Strain: Low Risk  (08/14/2021)   Overall Financial Resource Strain (CARDIA)    Difficulty of Paying Living Expenses: Not hard at all  Food Insecurity: No Food Insecurity (08/14/2021)   Hunger Vital Sign    Worried About  Running Out of Food in the Last Year: Never true    Ran Out of Food in the Last Year: Never true  Transportation Needs: No Transportation Needs (08/14/2021)   PRAPARE - Administrator, Civil Service (Medical): No    Lack of Transportation (Non-Medical): No  Physical Activity: Inactive (08/14/2021)   Exercise Vital Sign    Days of Exercise per Week: 0 days    Minutes of Exercise per Session: 0 min  Stress: No Stress Concern Present (08/14/2021)   [employer] of Occupational Health - Occupational Stress Questionnaire    Feeling of Stress : Not at all  Social Connections: Socially Isolated (08/14/2021)   Social Connection and Isolation Panel    Frequency of Communication with Friends and Family: More than three times a week    Frequency of Social Gatherings with Friends and Family: Three times a week    Attends Religious Services: Never    Active Member of Clubs or Organizations: No    Attends Banker Meetings: Never    Marital Status: Widowed     Family History: The patient's family history includes Bladder Cancer in his maternal grandfather; Breast cancer in his cousin and sister; Cancer in his maternal aunt and maternal aunt; Lung cancer in his father. There is no history of Prostate cancer, Colon cancer, or Pancreatic cancer. ROS:   Please see the history of present illness.    All 14 point review of systems negative except as described per history of present illness  EKGs/Labs/Other Studies Reviewed:    EKG Interpretation Date/Time:  Friday March 17 2024 14:27:01 EDT Ventricular Rate:  77 PR Interval:  164 QRS Duration:  92 QT Interval:  368 QTC Calculation: 416 R Axis:   -64  Text Interpretation: Normal sinus rhythm Left axis deviation When compared with ECG of 27-Feb-2021 09:56, Criteria for Anterior infarct are no longer Present Confirmed by Bernie Charleston [phone]) on 03/17/2024 2:42:43 PM    Recent Labs: No results found for requested  labs within last 365 days.  Recent Lipid Panel No results found for: CHOL, TRIG, HDL, CHOLHDL, VLDL, LDLCALC, LDLDIRECT  Physical Exam:    VS:  BP 110/72   Pulse 77   Ht 5' 8 (1.727 m)   Wt 170 lb (77.1 kg)   SpO2 98%   BMI 25.85 kg/m     Wt Readings from Last 3 Encounters:  03/17/24 170 lb (77.1 kg)  01/21/23 173 lb (78.5 kg)  04/23/21 175 lb (79.4 kg)     GEN:  Well nourished, well developed in no acute distress HEENT: Normal NECK: No JVD; No carotid bruits LYMPHATICS: No lymphadenopathy CARDIAC: RRR, no murmurs, no rubs, no gallops RESPIRATORY:  Clear to auscultation  without rales, wheezing or rhonchi  ABDOMEN: Soft, non-tender, non-distended MUSCULOSKELETAL:  No edema; No deformity  SKIN: Warm and dry LOWER EXTREMITIES: no swelling NEUROLOGIC:  Alert and oriented x 3 PSYCHIATRIC:  Normal affect   ASSESSMENT:    1. Paroxysmal atrial fibrillation (HCC)   2. Malignant neoplasm of prostate (HCC)    PLAN:    In order of problems listed above:  Paroxysmal atrial fibrillation.  Seems to maintain sinus rhythm, continue anticoagulation continue beta-blockade, denies have any palpitations overall doing well. History of prostate cancer.  Followed by urology. Cardiovascular preop evaluation.  Sadly he is not able to do 4 METS because of his hip problem.  I had a long discussion with him what to do with the situation I think we need to do a stress test to rule out any potential ischemia I will do a Lexiscan as well as echocardiogram to assess left ventricle ejection fraction.  He said he does not want to do it yet he would like to find out when surgery will be done and then he will decide when he want to proceed with those testing. Dyslipidemia I did review his K PN which show me LDL 83 HDL 46   Medication Adjustments/Labs and Tests Ordered: Current medicines are reviewed at length with the patient today.  Concerns regarding medicines are outlined above.   Orders Placed This Encounter  Procedures   EKG 12-Lead   Medication changes: No orders of the defined types were placed in this encounter.   Signed, Lamar DOROTHA Fitch, MD, [facility] 03/17/2024 3:00 PM     [facility]

## 2024-03-17 NOTE — Patient Instructions (Signed)

## 2024-06-19 DIAGNOSIS — I4891 Unspecified atrial fibrillation: Secondary | ICD-10-CM | POA: Diagnosis not present

## 2024-06-19 DIAGNOSIS — Z7901 Long term (current) use of anticoagulants: Secondary | ICD-10-CM | POA: Diagnosis not present

## 2024-06-19 DIAGNOSIS — I959 Hypotension, unspecified: Secondary | ICD-10-CM | POA: Diagnosis not present

## 2024-06-19 DIAGNOSIS — I480 Paroxysmal atrial fibrillation: Secondary | ICD-10-CM | POA: Diagnosis not present

## 2024-06-19 DIAGNOSIS — I361 Nonrheumatic tricuspid (valve) insufficiency: Secondary | ICD-10-CM | POA: Diagnosis not present

## 2024-06-20 DIAGNOSIS — Z7901 Long term (current) use of anticoagulants: Secondary | ICD-10-CM | POA: Diagnosis not present

## 2024-06-20 DIAGNOSIS — I480 Paroxysmal atrial fibrillation: Secondary | ICD-10-CM | POA: Diagnosis not present

## 2024-06-21 DIAGNOSIS — Z7901 Long term (current) use of anticoagulants: Secondary | ICD-10-CM | POA: Diagnosis not present

## 2024-06-21 DIAGNOSIS — I480 Paroxysmal atrial fibrillation: Secondary | ICD-10-CM | POA: Diagnosis not present

## 2024-06-21 DIAGNOSIS — I959 Hypotension, unspecified: Secondary | ICD-10-CM | POA: Diagnosis not present

## 2024-06-21 DIAGNOSIS — R001 Bradycardia, unspecified: Secondary | ICD-10-CM | POA: Diagnosis not present

## 2024-07-03 ENCOUNTER — Telehealth: Payer: Self-pay | Admitting: Cardiology

## 2024-07-03 ENCOUNTER — Other Ambulatory Visit: Payer: Self-pay

## 2024-07-03 MED ORDER — MIDODRINE HCL 10 MG PO TABS: 10.0000 mg | ORAL_TABLET | Freq: Three times a day (TID) | ORAL | 3 refills | Status: AC

## 2024-07-03 MED ORDER — AMIODARONE HCL 200 MG PO TABS: 200.0000 mg | ORAL_TABLET | Freq: Two times a day (BID) | ORAL | 3 refills | Status: DC

## 2024-07-03 NOTE — Telephone Encounter (Signed)
 Clarified medication was Midodrine 10 mg daily and not Mibolerone. Pt confirmed.  Pt aware that RX has been sent to his pharmacy.

## 2024-07-03 NOTE — Telephone Encounter (Signed)
 Pt called in and stated that he was given amiodarone 200mg  2x daily and Mibolerone 10mg  3X daily.  He rec'd this in the hosp and needs to know if he needs to keep taking it.  He only had 15 day supply.  He has an appt in Jan for his FU   Best number [phone]

## 2024-07-04 NOTE — Telephone Encounter (Signed)
 Scheduled

## 2024-07-12 ENCOUNTER — Encounter: Payer: Self-pay | Admitting: Cardiology

## 2024-07-12 ENCOUNTER — Ambulatory Visit: Attending: Cardiology | Admitting: Cardiology

## 2024-07-12 VITALS — BP 110/70 | HR 82 | Ht 68.0 in | Wt 166.0 lb

## 2024-07-12 DIAGNOSIS — C61 Malignant neoplasm of prostate: Secondary | ICD-10-CM

## 2024-07-12 DIAGNOSIS — K921 Melena: Secondary | ICD-10-CM

## 2024-07-12 DIAGNOSIS — I480 Paroxysmal atrial fibrillation: Secondary | ICD-10-CM | POA: Diagnosis not present

## 2024-07-12 MED ORDER — AMIODARONE HCL 200 MG PO TABS: 200.0000 mg | ORAL_TABLET | Freq: Every day | ORAL | 3 refills | Status: AC

## 2024-07-12 NOTE — Progress Notes (Signed)
 Cardiology Office Note:    Date:  07/12/2024   ID:  Gerald Encinas., DOB [birth year], MRN [id number]  PCP:  Gerald Charlene CROME, MD  Cardiologist:  Gerald Fitch, MD    Referring MD: Gerald Charlene CROME, MD   No chief complaint on file. I was in the hospital  History of Present Illness:    Gerald Keith. is a 78 y.o. male past medical history significant for paroxysmal atrial fibrillation, echocardiogram showing normal left ventricular ejection fraction, moderate LVH, trivial mitral valve regurgitation, chest Vascor equals 2, he is anticoagulated with Xarelto.  I did see him first time in the summer when he came for evaluation before elective hip surgery but he decided not to pursue it right now he want to postpone it.  He recently ended being in the hospital because of atrial fibrillation fast ventricular rate, he required cardioversion however after cardioversion he was hypotensive and he was given dopamine for short period of time and midodrine .  Comes today to months for follow-up overall doing fine denies have any palpitations.  He did notice fresh red blood on the tissue for last 2 days.  But he said today it stopped.  He is scheduled to see GI with consideration of colonoscopy which should be fine to pursue from my point of view.  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   Gastro-esophageal reflux disease with esophagitis 01/15/2023   GERD (gastroesophageal reflux disease)    Hematochezia 01/15/2023   History of 2019 novel coronavirus disease (COVID-19) 08/2020   per pt mild symptoms that resolved   Hx of colonic polyp 01/15/2023   Left lower quadrant pain 01/15/2023   Malignant neoplasm of prostate (HCC) 12/06/2020   Paroxysmal atrial fibrillation (HCC) 11/2022   CHADS-VASc 2   Prostate cancer [facility])    urologist-- dr Gerald---  dx 01/ 2022, Gleason 3+4   Wears glasses     Past Surgical History:  Procedure  Laterality Date   COLONOSCOPY  2021   last one   PROSTATE BIOPSY     RADIOACTIVE SEED IMPLANT N/A 03/31/2021   Procedure: RADIOACTIVE SEED IMPLANT/BRACHYTHERAPY IMPLANT;  Surgeon: Gerald Senior, MD;  Location: [facility];  Service: Urology;  Laterality: N/A;  63 SEEDS   SPACE OAR INSTILLATION N/A 03/31/2021   Procedure: SPACE OAR INSTILLATION;  Surgeon: Gerald Senior, MD;  Location: [facility];  Service: Urology;  Laterality: N/A;   TONSILLECTOMY     age 81   TOTAL HIP ARTHROPLASTY Right 2010    Current Medications: Current Meds  Medication Sig   acetaminophen (TYLENOL) 500 MG tablet Take 500 mg by mouth every 6 (six) hours as needed for mild pain or moderate pain.   amiodarone  (PACERONE ) 200 MG tablet Take 1 tablet (200 mg total) by mouth 2 (two) times daily.   cholecalciferol (VITAMIN D3) 25 MCG (1000 UNIT) tablet Take 1,000 Units by mouth daily.   midodrine  (PROAMATINE ) 10 MG tablet Take 1 tablet (10 mg total) by mouth 3 (three) times daily.   Multiple Vitamin (MULTIVITAMIN) tablet Take 1 tablet by mouth daily.   Omega-3 Fatty Acids (FISH OIL PO) Take 1 capsule by mouth daily.   Turmeric (CURCUMIN 95 PO) Take 1 capsule by mouth daily.   XARELTO 20 MG TABS tablet Take 20 mg by mouth daily.   zinc sulfate 220 (50 Zn) MG capsule Take 220 mg by mouth daily.  Allergies:   Patient has no known allergies.   Social History   Socioeconomic History   Marital status: Widowed    Spouse name: Not on file   Number of children: 2   Years of education: Not on file   Highest education level: Not on file  Occupational History   Not on file  Tobacco Use   Smoking status: Never   Smokeless tobacco: Never  Vaping Use   Vaping status: Never Used  Substance and Sexual Activity   Alcohol use: Yes    Comment: occasional   Drug use: Never   Sexual activity: Yes  Other Topics Concern   Not on file  Social History Narrative   Children - 1 boy and  1 girl   Social Drivers of Corporate Investment Banker Strain: Low Risk  (08/14/2021)   Overall Financial Resource Strain (CARDIA)    Difficulty of Paying Living Expenses: Not hard at all  Food Insecurity: No Food Insecurity (08/14/2021)   Hunger Vital Sign    Worried About Running Out of Food in the Last Year: Never true    Ran Out of Food in the Last Year: Never true  Transportation Needs: No Transportation Needs (08/14/2021)   PRAPARE - Administrator, Civil Service (Medical): No    Lack of Transportation (Non-Medical): No  Physical Activity: Inactive (08/14/2021)   Exercise Vital Sign    Days of Exercise per Week: 0 days    Minutes of Exercise per Session: 0 min  Stress: No Stress Concern Present (08/14/2021)   [employer] of Occupational Health - Occupational Stress Questionnaire    Feeling of Stress : Not at all  Social Connections: Socially Isolated (08/14/2021)   Social Connection and Isolation Panel    Frequency of Communication with Friends and Family: More than three times a week    Frequency of Social Gatherings with Friends and Family: Three times a week    Attends Religious Services: Never    Active Member of Clubs or Organizations: No    Attends Banker Meetings: Never    Marital Status: Widowed     Family History: The patient's family history includes Bladder Cancer in his maternal grandfather; Breast cancer in his cousin and sister; Cancer in his maternal aunt and maternal aunt; Lung cancer in his father. There is no history of Prostate cancer, Colon cancer, or Pancreatic cancer. ROS:   Please see the history of present illness.    All 14 point review of systems negative except as described per history of present illness  EKGs/Labs/Other Studies Reviewed:    EKG Interpretation Date/Time:  Wednesday July 12 2024 15:43:52 EST Ventricular Rate:  82 PR Interval:  172 QRS Duration:  106 QT Interval:  388 QTC  Calculation: 453 R Axis:   -59  Text Interpretation: Normal sinus rhythm Left axis deviation Septal infarct , age undetermined When compared with ECG of 17-Mar-2024 14:27, Septal infarct is now Present Confirmed by Gerald Keith [phone]) on 07/12/2024 3:50:20 PM    Recent Labs: No results found for requested labs within last 365 days.  Recent Lipid Panel No results found for: CHOL, TRIG, HDL, CHOLHDL, VLDL, LDLCALC, LDLDIRECT  Physical Exam:    VS:  BP 110/70   Pulse 82   Ht 5' 8 (1.727 m)   Wt 166 lb (75.3 kg)   SpO2 97%   BMI 25.24 kg/m     Wt Readings from Last 3 Encounters:  07/12/24 166 lb (  75.3 kg)  03/17/24 170 lb (77.1 kg)  01/21/23 173 lb (78.5 kg)     GEN:  Well nourished, well developed in no acute distress HEENT: Normal NECK: No JVD; No carotid bruits LYMPHATICS: No lymphadenopathy CARDIAC: RRR, no murmurs, no rubs, no gallops RESPIRATORY:  Clear to auscultation without rales, wheezing or rhonchi  ABDOMEN: Soft, non-tender, non-distended MUSCULOSKELETAL:  No edema; No deformity  SKIN: Warm and dry LOWER EXTREMITIES: no swelling NEUROLOGIC:  Alert and oriented x 3 PSYCHIATRIC:  Normal affect   ASSESSMENT:    1. Paroxysmal atrial fibrillation (HCC)   2. Malignant neoplasm of prostate (HCC)   3. Hematochezia    PLAN:    In order of problems listed above:  Paroxysmal atrial fibrillation maintaining sinus rhythm will reduce amiodarone  to 200 mg daily continue with Xarelto, will check CBC today to make sure he is not anemic.  There is some contemplation about potentially doing a colonoscopy which is fine from my point of view overall colonoscopy is a low risk.  If we need to stop Xarelto for 2 days before procedure that should be acceptable. Prostate cancer stable. He is strongly considering doing hip surgery.  If he decided to do it we most likely will do stress test before   Medication Adjustments/Labs and Tests Ordered: Current  medicines are reviewed at length with the patient today.  Concerns regarding medicines are outlined above.  Orders Placed This Encounter  Procedures   EKG 12-Lead   Medication changes: No orders of the defined types were placed in this encounter.   Signed, Gerald DOROTHA Fitch, MD, [facility] 07/12/2024 4:08 PM    [facility]

## 2024-07-12 NOTE — Addendum Note (Signed)
 Addended by: ARLOA PLANAS D on: 07/12/2024 04:23 PM   Modules accepted: Orders

## 2024-07-12 NOTE — Patient Instructions (Signed)
 Medication Instructions:   DECREASE: Amiodarone  200mg  1 tablet daily   Lab Work: CBC- today If you have labs (blood work) drawn today and your tests are completely normal, you will receive your results only by: MyChart Message (if you have MyChart) OR A paper copy in the mail If you have any lab test that is abnormal or we need to change your treatment, we will call you to review the results.   Testing/Procedures: None Ordered   Follow-Up: At [facility], you and your health needs are our priority.  As part of our continuing mission to provide you with exceptional heart care, we have created designated Provider Care Teams.  These Care Teams include your primary Cardiologist (physician) and Advanced Practice Providers (APPs -  Physician Assistants and Nurse Practitioners) who all work together to provide you with the care you need, when you need it.  We recommend signing up for the patient portal called MyChart.  Sign up information is provided on this After Visit Summary.  MyChart is used to connect with patients for Virtual Visits (Telemedicine).  Patients are able to view lab/test results, encounter notes, upcoming appointments, etc.  Non-urgent messages can be sent to your provider as well.   To learn more about what you can do with MyChart, go to [email or web address].    Your next appointment:   3 month(s)  The format for your next appointment:   In Person  Provider:   Lamar Fitch, MD    Other Instructions NA

## 2024-07-13 LAB — CBC
Hematocrit: 39.6 % (ref 37.5–51.0)
Hemoglobin: 13 g/dL (ref 13.0–17.7)
MCH: 29.7 pg (ref 26.6–33.0)
MCHC: 32.8 g/dL (ref 31.5–35.7)
MCV: 90 fL (ref 79–97)
Platelets: 319 x10E3/uL (ref 150–450)
RBC: 4.38 x10E6/uL (ref 4.14–5.80)
RDW: 13 % (ref 11.6–15.4)
WBC: 6.9 x10E3/uL (ref 3.4–10.8)

## 2024-07-21 ENCOUNTER — Ambulatory Visit: Payer: Self-pay | Admitting: Cardiology

## 2024-07-28 ENCOUNTER — Telehealth: Payer: Self-pay

## 2024-07-28 NOTE — Telephone Encounter (Signed)
 Left message on My Chart with lab results per Dr. Karry note. Routed to PCP

## 2024-09-07 ENCOUNTER — Ambulatory Visit: Admitting: Cardiology

## 2024-10-13 ENCOUNTER — Ambulatory Visit: Admitting: Cardiology
